# Patient Record
Sex: Male | Born: 1935 | Race: White | Hispanic: No | Smoking: Former smoker
Health system: Southern US, Community
[De-identification: ages and names within clinical notes are randomized; demographics above are authoritative.]

## PROBLEM LIST (undated history)

## (undated) DIAGNOSIS — M1990 Unspecified osteoarthritis, unspecified site: Secondary | ICD-10-CM

## (undated) DIAGNOSIS — K219 Gastro-esophageal reflux disease without esophagitis: Secondary | ICD-10-CM

## (undated) DIAGNOSIS — K5790 Diverticulosis of intestine, part unspecified, without perforation or abscess without bleeding: Secondary | ICD-10-CM

## (undated) DIAGNOSIS — I10 Essential (primary) hypertension: Secondary | ICD-10-CM

## (undated) DIAGNOSIS — Z955 Presence of coronary angioplasty implant and graft: Secondary | ICD-10-CM

## (undated) DIAGNOSIS — R351 Nocturia: Secondary | ICD-10-CM

## (undated) DIAGNOSIS — R238 Other skin changes: Secondary | ICD-10-CM

## (undated) DIAGNOSIS — R350 Frequency of micturition: Secondary | ICD-10-CM

## (undated) DIAGNOSIS — I2510 Atherosclerotic heart disease of native coronary artery without angina pectoris: Secondary | ICD-10-CM

## (undated) DIAGNOSIS — Z8719 Personal history of other diseases of the digestive system: Secondary | ICD-10-CM

## (undated) DIAGNOSIS — K5900 Constipation, unspecified: Secondary | ICD-10-CM

## (undated) DIAGNOSIS — E785 Hyperlipidemia, unspecified: Secondary | ICD-10-CM

## (undated) DIAGNOSIS — R233 Spontaneous ecchymoses: Secondary | ICD-10-CM

## (undated) HISTORY — PX: OTHER SURGICAL HISTORY: SHX169

## (undated) HISTORY — PX: CORONARY ANGIOPLASTY: SHX604

## (undated) HISTORY — PX: COLONOSCOPY: SHX174

---

## 1999-08-12 ENCOUNTER — Encounter: Payer: Self-pay | Admitting: Ophthalmology

## 1999-08-12 ENCOUNTER — Ambulatory Visit (HOSPITAL_COMMUNITY): Admission: RE | Admit: 1999-08-12 | Discharge: 1999-08-13 | Payer: Self-pay | Admitting: Ophthalmology

## 1999-11-22 ENCOUNTER — Ambulatory Visit (HOSPITAL_COMMUNITY): Admission: RE | Admit: 1999-11-22 | Discharge: 1999-11-22 | Payer: Self-pay | Admitting: Ophthalmology

## 2002-06-17 ENCOUNTER — Ambulatory Visit (HOSPITAL_COMMUNITY): Admission: RE | Admit: 2002-06-17 | Discharge: 2002-06-18 | Payer: Self-pay | Admitting: Cardiovascular Disease

## 2012-03-08 ENCOUNTER — Other Ambulatory Visit (HOSPITAL_COMMUNITY): Payer: Self-pay | Admitting: Cardiovascular Disease

## 2012-03-08 DIAGNOSIS — I719 Aortic aneurysm of unspecified site, without rupture: Secondary | ICD-10-CM

## 2012-03-09 ENCOUNTER — Other Ambulatory Visit: Payer: Self-pay | Admitting: *Deleted

## 2012-03-09 ENCOUNTER — Emergency Department (HOSPITAL_COMMUNITY)
Admission: EM | Admit: 2012-03-09 | Discharge: 2012-03-09 | Disposition: A | Discharge status: Home / Self Care | Payer: Medicare Other | Attending: Emergency Medicine | Admitting: Emergency Medicine

## 2012-03-09 ENCOUNTER — Encounter (HOSPITAL_COMMUNITY): Payer: Self-pay | Admitting: Pharmacy Technician

## 2012-03-09 ENCOUNTER — Ambulatory Visit (HOSPITAL_COMMUNITY): Admission: RE | Admit: 2012-03-09 | Payer: Self-pay | Source: Ambulatory Visit

## 2012-03-09 ENCOUNTER — Encounter (HOSPITAL_COMMUNITY): Payer: Self-pay | Admitting: *Deleted

## 2012-03-09 ENCOUNTER — Other Ambulatory Visit: Payer: Self-pay

## 2012-03-09 ENCOUNTER — Ambulatory Visit (HOSPITAL_COMMUNITY)
Admission: RE | Admit: 2012-03-09 | Discharge: 2012-03-09 | Disposition: A | Discharge status: Home / Self Care | Payer: Medicare Other | Source: Ambulatory Visit | Attending: Cardiovascular Disease | Admitting: Cardiovascular Disease

## 2012-03-09 DIAGNOSIS — K8020 Calculus of gallbladder without cholecystitis without obstruction: Secondary | ICD-10-CM | POA: Insufficient documentation

## 2012-03-09 DIAGNOSIS — Y831 Surgical operation with implant of artificial internal device as the cause of abnormal reaction of the patient, or of later complication, without mention of misadventure at the time of the procedure: Secondary | ICD-10-CM | POA: Insufficient documentation

## 2012-03-09 DIAGNOSIS — I719 Aortic aneurysm of unspecified site, without rupture: Secondary | ICD-10-CM

## 2012-03-09 DIAGNOSIS — M48061 Spinal stenosis, lumbar region without neurogenic claudication: Secondary | ICD-10-CM | POA: Insufficient documentation

## 2012-03-09 DIAGNOSIS — I714 Abdominal aortic aneurysm, without rupture, unspecified: Secondary | ICD-10-CM

## 2012-03-09 DIAGNOSIS — I723 Aneurysm of iliac artery: Secondary | ICD-10-CM | POA: Insufficient documentation

## 2012-03-09 DIAGNOSIS — T82598A Other mechanical complication of other cardiac and vascular devices and implants, initial encounter: Secondary | ICD-10-CM | POA: Insufficient documentation

## 2012-03-09 DIAGNOSIS — I10 Essential (primary) hypertension: Secondary | ICD-10-CM | POA: Insufficient documentation

## 2012-03-09 HISTORY — DX: Presence of coronary angioplasty implant and graft: Z95.5

## 2012-03-09 HISTORY — DX: Essential (primary) hypertension: I10

## 2012-03-09 LAB — CBC
HCT: 38.3 % — ABNORMAL LOW (ref 39.0–52.0)
Hemoglobin: 13 g/dL (ref 13.0–17.0)
MCH: 30.1 pg (ref 26.0–34.0)
MCHC: 33.9 g/dL (ref 30.0–36.0)
RDW: 12.9 % (ref 11.5–15.5)

## 2012-03-09 LAB — DIFFERENTIAL
Basophils Absolute: 0 10*3/uL (ref 0.0–0.1)
Basophils Relative: 0 % (ref 0–1)
Eosinophils Absolute: 0.3 10*3/uL (ref 0.0–0.7)
Monocytes Absolute: 0.4 10*3/uL (ref 0.1–1.0)
Monocytes Relative: 5 % (ref 3–12)

## 2012-03-09 LAB — CREATININE, SERUM
Creatinine, Ser: 0.88 mg/dL (ref 0.50–1.35)
GFR calc Af Amer: >90 mL/min (ref >90)
GFR calc non Af Amer: 78 mL/min — ABNORMAL LOW (ref >90)

## 2012-03-09 LAB — CARDIAC PANEL(CRET KIN+CKTOT+MB+TROPI)
CK, MB: 3.2 ng/mL (ref 0.3–4.0)
Relative Index: INVALID (ref 0.0–2.5)
Troponin I: <0.3 ng/mL (ref <0.30)

## 2012-03-09 LAB — ABO/RH: ABO/RH(D): A POS

## 2012-03-09 LAB — BASIC METABOLIC PANEL
BUN: 22 mg/dL (ref 6–23)
Calcium: 9.3 mg/dL (ref 8.4–10.5)
Creatinine, Ser: 0.8 mg/dL (ref 0.50–1.35)
GFR calc Af Amer: >90 mL/min (ref >90)
GFR calc non Af Amer: 82 mL/min — ABNORMAL LOW (ref >90)

## 2012-03-09 LAB — BUN: BUN: 24 mg/dL — ABNORMAL HIGH (ref 6–23)

## 2012-03-09 MED ORDER — IOHEXOL 350 MG/ML SOLN
100.0000 mL | Freq: Once | INTRAVENOUS | Status: AC | PRN
  Administered 2012-03-09: 100 mL via INTRAVENOUS

## 2012-03-09 NOTE — Discharge Instructions (Signed)
Abdominal Aortic Aneurysm Follow up with Dr. Arbie Cookey and Dr. Allyson Sabal as scheduled.  Return to the ED if you develop new or worsening symptoms.  An aneurysm is the enlargement (dilatation), bulging, or ballooning out of part of the wall of a vein or artery. An aortic aneurysm is a bulging in the largest artery of the body. This artery supplies blood from the heart to the rest of the body.  The first part of the aorta is called the thoracic aorta. It leaves the heart, rises (ascends), arches, and goes down (descends) through the chest until it reaches the diaphragm. The diaphragm is the muscular part between the chest and abdomen.   The second part of the aorta is called the abdominal aorta after it has passed the diaphragm and continues down through the abdomen. The abdominal aorta ends where it splits to form the two iliac arteries that go to the legs.  Aortic aneurysms can develop anywhere along the length of the aorta. The majority are located along the abdominal aorta. The major concern with an aortic aneurysm is that it can enlarge and rupture. This can cause death unless diagnosed and treated promptly. Aneurysms can also develop blood clots or infections. CAUSES  Many aortic aneurysms are caused by arteriosclerosis. Arteriosclerosis can weaken the aortic wall. The pressure of the blood being pumped through the aorta causes it to balloon out at the site of weakness. Therefore, high blood pressure (hypertension) is associated with aneurysm. Other risk factors include:  Age over 64.   Tobacco use.   Being male.   White race.   Family history of aneurysm.   Less frequent causes of abdominal aortic aneurysms include:   Connective tissue diseases.   Abdominal trauma.   Inflammation of blood vessles (arteritis).   Inherited (congenital) malformations.   Infection.  SYMPTOMS  The signs and symptoms of an unruptured aneurysm will partly depend on its size and rate of growth.   Abdominal  aortic aneurysms may cause pain. The pain typically has a deep quality as if it is piercing into the person. It is felt most often in the lower back area. The pain is usually steady but may be relieved by changing your body position.   The person may also become aware of an abnormally prominent pulse in the belly (abdominal pulsation).  DIAGNOSIS  An aortic aneurysm may be discovered by chance on physical exam, or on X-ray studies done for other reasons. It may be suspected because of other problems such as back or abdominal pain. The following tests may help identify the problem.  X-rays of the abdomen can show calcium deposits in the aneurysm wall.   CT scanning of the abdomen, particularly with contrast medium, is accurate at showing the exact size and shape of the aneurysm.   Ultrasounds give a clear picture of the size of an aneurysm (about 98% accuracy).   MRI scanning is accurate, but often unnecessary.   An abdominal angiogram shows the source of the major blood vessels arising from the aorta. It reveals the size and extent of any aneurysm. It can also show a clot clinging to the wall of the aneurysm (mural thrombus).  TREATMENT  Treating an abdominal aortic aneurysm depends on the size. A rupture of an aneurysm is uncommon when they are less than 5 cm wide (2 inches). Rupture is far more common in aneurysms that are over 6 cm wide (2.4 inches).  Surgical repair is usually recommended for all aneurysms over 6  cm wide (2.4 inches). This depends on the health, age, and other circumstances of the individual. This type of surgery consists of opening the abdomen, removing the aneurysm, and sewing a synthetic graft (similar to a cloth tube) in its place. A less invasive form of this surgery, using stent grafts, is sometimes recommended.   For most patients, elective repair is recommended for aneurysms between 4 and 6 cm (1.6 and 2.4 inches). Elective means the surgery can be done at your  convenience. This should not be put off too long if surgery is recommended.   If you smoke, stop immediately. Smoking is a major risk factor for enlargement and rupture.   Medications may be used to help decrease complications - these include medicine to lower blood pressure and control cholesterol.  HOME CARE INSTRUCTIONS   If you smoke, stop. Do not start smoking.   Take all medications as prescribed.   Your caregiver will tell you when to have your aneurysm rechecked, either by ultrasound or CT scan.   If your caregiver has given you a follow-up appointment, it is very important to keep that appointment. Not keeping the appointment could result in a chronic or permanent injury, pain, or disability. If there is any problem keeping the appointment, you must call back to this facility for assistance.  SEEK MEDICAL CARE IF:   You develop mild abdominal pain or pressure.   You are able to feel or perceive your aneurysm, and you sense any change.  SEEK IMMEDIATE MEDICAL CARE IF:   You develop severe abdominal pain, or severe pain moving (radiating) to your back.   You suddenly develop cold or blue toes or feet.   You suddenly develop lightheadedness or fainting spells.  MAKE SURE YOU:   Understand these instructions.   Will watch your condition.   Will get help right away if you are not doing well or get worse.  Document Released: 07/27/2005 Document Revised: 10/06/2011 Document Reviewed: 05/20/2008 [facility] Patient Information 2012 [place], [place].

## 2012-03-09 NOTE — ED Notes (Signed)
CT scan today and MD office called him to come to ED for leaking aneurysm, patient denies pain/sob

## 2012-03-09 NOTE — ED Provider Notes (Signed)
History     CSN: [id number]  Arrival date & time 03/09/12  1224   First MD Initiated Contact with Patient 03/09/12 1232      Chief Complaint  Patient presents with  . Aneurysm    (Consider location/radiation/quality/duration/timing/severity/associated sxs/prior treatment) HPI Comments: Patient presents for evaluation of a leaking aortoiliac graft. He had this placed in 2002 in [place]. He followed by Dr. Allyson Sabal was increased in size. CT scan today shows an oblique with aneurysm. Patient denies any pain, shortness of breath, bleeding. No chest pain, back pain, abdominal pain, nausea or vomiting. Denies any weakness, numbness or tingling.  The history is provided by the patient.    Past Medical History  Diagnosis Date  . Hypertension   . Stented coronary artery     History reviewed. No pertinent past surgical history.  No family history on file.  History  Substance Use Topics  . Smoking status: Never Smoker   . Smokeless tobacco: Not on file  . Alcohol Use: No      Review of Systems  Constitutional: Negative for fever, activity change and appetite change.  HENT: Negative for congestion and rhinorrhea.   Respiratory: Negative for cough, chest tightness and shortness of breath.   Cardiovascular: Negative for chest pain.  Gastrointestinal: Negative for nausea, vomiting and abdominal pain.  Genitourinary: Negative for dysuria and hematuria.  Musculoskeletal: Negative for back pain.  Neurological: Negative for dizziness, light-headedness and headaches.    Allergies  Review of patient's allergies indicates no known allergies.  Home Medications  No current outpatient prescriptions on file.  BP 144/75  Temp(Src) 97.3 F (36.3 C) (Oral)  Resp 20  SpO2 97%  Physical Exam  Constitutional: He is oriented to person, place, and time. He appears well-developed and well-nourished. No distress.  HENT:  Head: Normocephalic and atraumatic.  Mouth/Throat: Oropharynx is  clear and moist. No oropharyngeal exudate.  Eyes: Conjunctivae and EOM are normal. Pupils are equal, round, and reactive to light.  Neck: Normal range of motion. Neck supple.  Cardiovascular: Normal rate, regular rhythm and normal heart sounds.        +2 femoral, radial, DP pulse  Pulmonary/Chest: Effort normal and breath sounds normal. No respiratory distress.  Abdominal: Soft. He exhibits no mass. There is no tenderness. There is no rebound and no guarding.       No palpable mass  Musculoskeletal: Normal range of motion. He exhibits no edema and no tenderness.  Neurological: He is alert and oriented to person, place, and time. No cranial nerve deficit.  Skin: Skin is warm.    ED Course  Procedures (including critical care time)   Labs Reviewed  CBC  DIFFERENTIAL  BASIC METABOLIC PANEL  TYPE AND SCREEN  PROTIME-INR  CARDIAC PANEL(CRET KIN+CKTOT+MB+TROPI)   Ct Angio Chest W/cm &/or Wo Cm  03/09/2012  *RADIOLOGY REPORT*  Clinical Data:  Stent graft  CT ANGIOGRAPHY CHEST, ABDOMEN AND PELVIS  Technique:  Multidetector CT imaging through the chest, abdomen and pelvis was performed using the standard protocol during bolus administration of intravenous contrast.  Multiplanar reconstructed images including MIPs were obtained and reviewed to evaluate the vascular anatomy. Precontrast images were not performed.  8-minute delayed images were performed.  Contrast: OMNIPAQUE IOHEXOL 350 MG/ML SOLN  Comparison:   None.  CTA CHEST  Findings:  No evidence of aortic dissection or transection.  No evidence of aneurysm.  Mild aortic valvular calcifications.  Left subclavian, left common carotid artery, innominate artery, right subclavian artery,  right common carotid artery are patent. Atherosclerotic changes at the origin of the right subclavian and in the arch are noted.  No definite filling defect in the pulmonary arterial tree to suggest acute pulmonary thromboembolism.  No evidence of abnormal  mediastinal adenopathy.  No pericardial effusion.  Mild coronary artery calcifications.  Clear lungs.  No pneumothorax and no pleural effusion.  Mild T11 compression deformity has a chronic appearance.   Review of the MIP images confirms the above findings.  IMPRESSION: No evidence of aortic aneurysm or dissection.  CTA ABDOMEN AND PELVIS  Findings:  Aortobi-iliac stent graft is in place.  An obvious endoleak is present as there is avid enhancement of the central portion of the aneurysm sac adjacent to the iliac limbs of the graft.  Maximal aneurysm sac diameter is 9.2 cm.  The central area of contrast enhancement within the aneurysm sac is 3.5 x 5.0 cm and causes severe mass effect upon the iliac limbs of the graft.  This narrows the left iliac limb which to 5 mm.  The lead can be confirmed comparing arterial phase images to delayed phase images with Hounsfield unit measurements. No obvious lumbar vessel is seen leading to the area of endoleak.  There is no obvious type 1 endoleak from the aortic neck or the landing zones.  Ectasia of the left common iliac artery measures up to 2.4 cm. Right common iliac artery is 1.6 cm in caliber.  There is no evidence of contrast extravasation or hemorrhage beyond the aneurysm sac.  Native internal and external iliac arteries are patent.  IMA branches reconstitute.  Renal arteries are patent bilaterally.  SMA and celiac are patent.  Cholelithiasis.  Liver, pancreas, spleen, adrenal glands are within normal limits.  Chronic changes of the kidneys.  Normal appendix. No free fluid.  No abnormal adenopathy.  Degenerative changes in the lumbar spine are present.  There is severe critical spinal stenosis at at L4-5.   Review of the MIP images confirms the above findings.  IMPRESSION: Aortobi-iliac stent graft with a significant endoleak exerting mass effect upon the iliac limbs of the graft as described.  Aneurysm sac diameter is 9.2 cm.  It is uncertain as to whether this represents  a type 2 or type 1 endoleak. Critical Value/emergent results were called by telephone at the time of interpretation on 03/09/2012  at 1140 hours   to  Samara Deist, RN, Dr. Hazle Coca office, who verbally acknowledged these results.  Cholelithiasis  Severe spinal stenosis at X9-1 which certainly could contribute to hip pain.  Original Report Authenticated By: Donavan Burnet, M.D.   Ct Angio Abd/pel W/ And/or W/o  03/09/2012  *RADIOLOGY REPORT*  Clinical Data:  Stent graft  CT ANGIOGRAPHY CHEST, ABDOMEN AND PELVIS  Technique:  Multidetector CT imaging through the chest, abdomen and pelvis was performed using the standard protocol during bolus administration of intravenous contrast.  Multiplanar reconstructed images including MIPs were obtained and reviewed to evaluate the vascular anatomy. Precontrast images were not performed.  8-minute delayed images were performed.  Contrast: OMNIPAQUE IOHEXOL 350 MG/ML SOLN  Comparison:   None.  CTA CHEST  Findings:  No evidence of aortic dissection or transection.  No evidence of aneurysm.  Mild aortic valvular calcifications.  Left subclavian, left common carotid artery, innominate artery, right subclavian artery, right common carotid artery are patent. Atherosclerotic changes at the origin of the right subclavian and in the arch are noted.  No definite filling defect in the pulmonary arterial  tree to suggest acute pulmonary thromboembolism.  No evidence of abnormal mediastinal adenopathy.  No pericardial effusion.  Mild coronary artery calcifications.  Clear lungs.  No pneumothorax and no pleural effusion.  Mild T11 compression deformity has a chronic appearance.   Review of the MIP images confirms the above findings.  IMPRESSION: No evidence of aortic aneurysm or dissection.  CTA ABDOMEN AND PELVIS  Findings:  Aortobi-iliac stent graft is in place.  An obvious endoleak is present as there is avid enhancement of the central portion of the aneurysm sac adjacent to the iliac  limbs of the graft.  Maximal aneurysm sac diameter is 9.2 cm.  The central area of contrast enhancement within the aneurysm sac is 3.5 x 5.0 cm and causes severe mass effect upon the iliac limbs of the graft.  This narrows the left iliac limb which to 5 mm.  The lead can be confirmed comparing arterial phase images to delayed phase images with Hounsfield unit measurements. No obvious lumbar vessel is seen leading to the area of endoleak.  There is no obvious type 1 endoleak from the aortic neck or the landing zones.  Ectasia of the left common iliac artery measures up to 2.4 cm. Right common iliac artery is 1.6 cm in caliber.  There is no evidence of contrast extravasation or hemorrhage beyond the aneurysm sac.  Native internal and external iliac arteries are patent.  IMA branches reconstitute.  Renal arteries are patent bilaterally.  SMA and celiac are patent.  Cholelithiasis.  Liver, pancreas, spleen, adrenal glands are within normal limits.  Chronic changes of the kidneys.  Normal appendix. No free fluid.  No abnormal adenopathy.  Degenerative changes in the lumbar spine are present.  There is severe critical spinal stenosis at at L4-5.   Review of the MIP images confirms the above findings.  IMPRESSION: Aortobi-iliac stent graft with a significant endoleak exerting mass effect upon the iliac limbs of the graft as described.  Aneurysm sac diameter is 9.2 cm.  It is uncertain as to whether this represents a type 2 or type 1 endoleak. Critical Value/emergent results were called by telephone at the time of interpretation on 03/09/2012  at 1140 hours   to  Samara Deist, RN, Dr. Hazle Coca office, who verbally acknowledged these results.  Cholelithiasis  Severe spinal stenosis at Z6-1 which certainly could contribute to hip pain.  Original Report Authenticated By: Donavan Burnet, M.D.     No diagnosis found.    MDM  Endoleak from aortoiliac graft. Vitals stable.  No abdominal pain, chest or back pain.   Labs, type  and cross, d/w Dr. Arbie Cookey.  Extensive discussion with Dr. Arbie Cookey who has seen patient.  Patient has not had graft imaged since surgery in 2003.  Dr. Arbie Cookey suspects endoleak is type 2 and stable.  He suspects patient has had process ongoing for a long period of time and not had imaging in interim.  He is asymptomatic and stable for discharge.  Aortogram with Dr. Imogene Burn scheduled for MOnday.  Patient and family in agreement.   Date: 03/09/2012  Rate: 92  Rhythm: normal sinus rhythm  QRS Axis: left  Intervals: normal  ST/T Wave abnormalities: normal  Conduction Disutrbances:none  Narrative Interpretation:   Old EKG Reviewed: unchanged        Glynn Octave, MD 03/09/12 1711

## 2012-03-09 NOTE — Consult Note (Signed)
I was consulted urgently regarding his CT scan today documenting endoleak. I have reviewed his CT scan, discuss this with the patient and his daughter present, and I discussed this with Dr. Hazle Coca office. The patient had a stent graft repair in [facility] an approximately 2002. He has had ultrasound followup at that time. He had an ultrasound 4 days ago suggested a change in his prior studies and he was scheduled for a CT scan today. A CT scan demonstrated a 9 cm aneurysmal sac within endoleak. He was told to come immediately to the emergency department and I was consulted. The patient is completely asymptomatic. Specifically he has no abdominal or back pain. He had no new concerns other than his routine followup. He feels that his aneurysm size was proximally 4-1/2 cm when it was repaired in 2002. It appeared or his daughter recall any discussion of aneurysm sac size since then. I spoke with Dr. Benay Spice office staff and they reviewed the finding is of his ultrasound from 2010, 2011, and 2012. None of these document the sac size. They do report good flow through both limbs with no evidence of endoleak. He does have a prior history of significant coronary disease with multiple prior coronary stents. He does not have a history of peripheral aneurysm. He did have coiling of a lower pole accessory left renal artery at the time of the stent graft repair.  Past Medical History  Diagnosis Date  . Hypertension   . Stented coronary artery     History  Substance Use Topics  . Smoking status: Never Smoker   . Smokeless tobacco: Not on file  . Alcohol Use: No    No family history on file.  No Known Allergies  No current facility-administered medications for this encounter. Current outpatient prescriptions:aspirin 500 MG EC tablet, Take 500 mg by mouth every 6 (six) hours as needed., Disp: , Rfl: ;  clopidogrel (PLAVIX) 75 MG tablet, Take 75 mg by mouth daily., Disp: , Rfl: ;  colesevelam  (WELCHOL) 625 MG tablet, Take 2,500 mg by mouth 2 (two) times daily with a meal., Disp: , Rfl: ;  gemfibrozil (LOPID) 600 MG tablet, Take 600 mg by mouth 2 (two) times daily before a meal., Disp: , Rfl:  metoprolol (TOPROL-XL) 200 MG 24 hr tablet, Take 200 mg by mouth daily., Disp: , Rfl: ;  NIFEdipine (PROCARDIA XL/ADALAT-CC) 60 MG 24 hr tablet, Take 60 mg by mouth daily., Disp: , Rfl: ;  nitroGLYCERIN (NITROSTAT) 0.4 MG SL tablet, Place 0.4 mg under the tongue every 5 (five) minutes as needed. As needed for chest pain., Disp: , Rfl: ;  omeprazole (PRILOSEC) 20 MG capsule, Take 20 mg by mouth daily., Disp: , Rfl:  psyllium (HYDROCIL/METAMUCIL) 95 % PACK, Take 1 packet by mouth daily., Disp: , Rfl: ;  triamterene-hydrochlorothiazide (MAXZIDE-25) 37.5-25 MG per tablet, Take 1 tablet by mouth daily., Disp: , Rfl:  Facility-Administered Medications Ordered in Other Encounters: iohexol (OMNIPAQUE) 350 MG/ML injection 100 mL, 100 mL, Intravenous, Once PRN, Medication Radiologist, MD, 100 mL at 03/09/12 1106  BP 117/79  Pulse 87  Temp(Src) 97.9 F (36.6 C) (Oral)  Resp 16  Ht 7\' 3"  (2.21 m)  Wt 230 lb (104.327 kg)  BMI 21.36 kg/m2  SpO2 97%  Body mass index is 21.36 kg/(m^2).   Review of systems: Negative except for above  Physical exam: Well-developed well-nourished white male appearing stated age in no acute distress HEENT normal. 2+ radial pulses. 2+ femoral pulses.  2+ popliteal pulses. 2+ dorsalis pedis pulses. He is no evidence of femoral or popliteal aneurysm. Chest good breath air exchange bilaterally. Monitors normal sinus rhythm. Abdomen soft nontender I do not feel any masses. I do not feel a pulsatile mass in his aorta. Musculoskeletal no clubbing or cyanosis. Neurologically grossly intact. Skin without ulcers or rashes.  CT scan abdomen and pelvis: This shows what appears to be a Ancure aortic stent graft. There does not appear to be a type I endoleak. There does appear to be compression  of the limbs of the graft more so on the left than on the right. There is an endoleak and the sac and the maximal size is 9 cm.   Impression and plan: Enlarging abdominal aortic aneurysm sac after stent graft repair in 2002. I did not have any documentation as to prior sizes of the aneurysm. According to the patient patient and his daughter they have not had a stent graft CT scan followup since implantation. I explained to the patient that this does require elective evaluation and possible treatment since this is a large aneurysm with a large endoleak. He is discharged from the emergency department today and we will schedule him for arteriography next week to begin to evaluate the cause of his endoleak. I explained that this is not equivalent to a leaking aneurysm. They are comfortable with this discussion and will be discharged. We will see him again next week for arteriography

## 2012-03-09 NOTE — ED Notes (Signed)
Family at the bedside, daughter Linton Rump) [phone] home. (cell) [phone]

## 2012-03-09 NOTE — ED Notes (Signed)
Pt has hx of AAA, had a stent placed in it in 2002.  Has been having ultrasounds yearly since that time.  Reports that he had his routine [place] on Monday and was sent for a CT scan today.  Pt was called and told that his aneurysm was leaking and he needed to come to the ED for further eval and possible stent placement and repair.  No distress noted, pt resting.  Denies pain, no bounding mass in abdomen.

## 2012-03-12 ENCOUNTER — Ambulatory Visit (HOSPITAL_COMMUNITY)
Admission: RE | Admit: 2012-03-12 | Discharge: 2012-03-12 | Disposition: A | Discharge status: Home / Self Care | Payer: Medicare Other | Source: Ambulatory Visit | Attending: Vascular Surgery | Admitting: Vascular Surgery

## 2012-03-12 ENCOUNTER — Encounter (HOSPITAL_COMMUNITY)
Admission: RE | Disposition: A | Discharge status: Home / Self Care | Payer: Self-pay | Source: Ambulatory Visit | Attending: Vascular Surgery

## 2012-03-12 ENCOUNTER — Other Ambulatory Visit (HOSPITAL_COMMUNITY): Payer: Self-pay

## 2012-03-12 DIAGNOSIS — I714 Abdominal aortic aneurysm, without rupture, unspecified: Secondary | ICD-10-CM

## 2012-03-12 HISTORY — PX: ABDOMINAL AORTAGRAM: SHX5454

## 2012-03-12 LAB — POCT I-STAT, CHEM 8
BUN: 22 mg/dL (ref 6–23)
Calcium, Ion: 1.17 mmol/L (ref 1.12–1.32)
Chloride: 105 mEq/L (ref 96–112)
Creatinine, Ser: 1.3 mg/dL (ref 0.50–1.35)
Glucose, Bld: 110 mg/dL — ABNORMAL HIGH (ref 70–99)

## 2012-03-12 SURGERY — ABDOMINAL AORTAGRAM
Anesthesia: LOCAL

## 2012-03-12 MED ORDER — MIDAZOLAM HCL 2 MG/2ML IJ SOLN
INTRAMUSCULAR | Status: AC
  Filled 2012-03-12: qty 2

## 2012-03-12 MED ORDER — SODIUM CHLORIDE 0.9 % IV SOLN
INTRAVENOUS | Status: DC
  Administered 2012-03-12: 09:00:00 via INTRAVENOUS

## 2012-03-12 MED ORDER — FENTANYL CITRATE 0.05 MG/ML IJ SOLN
INTRAMUSCULAR | Status: AC
  Filled 2012-03-12: qty 2

## 2012-03-12 NOTE — Interval H&P Note (Signed)
[facility]  History and Physical Update  The patient was interviewed and re-examined.  The patient's previous History and Physical has been reviewed and is unchanged.  There is no change in the plan of care: aortogram.  Leonides Sake, MD [facility]: [phone] Pager: [phone]  03/12/2012, 9:13 AM

## 2012-03-12 NOTE — Op Note (Addendum)
OPERATIVE NOTE   PROCEDURE: 1.  Right common femoral artery cannulation under ultrasound guidance 2.  Aortogram 3.  Left common iliac artery selection 4.  Bilateral pelvic angiogram  PRE-OPERATIVE DIAGNOSIS: s/p EVAR, endoleak, enlarging abdominal aortic aneurysm  POST-OPERATIVE DIAGNOSIS: same as above   SURGEON: Leonides Sake, MD  ANESTHESIA: conscious sedation  ESTIMATED BLOOD LOSS: 30 cc  CONTRAST: 170 cc  FINDING(S):  Aorta: patent, Ancure endograft present, no evidence of proximal type I endoleak, Obvious filling of aortic sac without evidence of discrete tear or endoleak: suggestive of type IV endoleak through graft material  Superior mesenteric artery: Widely patent Celiac artery: Partially imaged   Right Left  RA Patent, no nephrogram Patent, no nephrogram  CIA Patent, Right limb patent without evidence of distal Type I endoleak Patent, aneurysmal, lumen up to 2.0 cm, Left limb patent without evidence of distal Type I endoleak  EIA Patent Patent  IIA Patent Patent  CFA Patent Patent   SPECIMEN(S):  none  INDICATIONS:   Johnny Leblanc is a 78 y.o. male who presents with enlarging abdominal aortic aneurysm despite previous endovascular aortic repair.  The patient was found to an unknown type endoleak.  The patient presents for: diagnostic aortography to try to determine the source of the endoleak.  I discussed with the patient the nature of angiographic procedures, especially the limited patencies of any endovascular intervention.  The patient is aware of that the risks of an angiographic procedure include but are not limited to: bleeding, infection, access site complications, renal failure, embolization, rupture of vessel, dissection, possible need for emergent surgical intervention, possible need for surgical procedures to treat the patient's pathology, and stroke and death.  The patient is aware of the risks and agrees to proceed.  DESCRIPTION: After full informed consent  was obtained from the patient, the patient was brought back to the angiography suite.  The patient was placed supine upon the angiography table and connected to monitoring equipment.  The patient was then given conscious sedation, the amounts of which are documented in the patient's chart.  The patient was prepped and drape in the standard fashion for an angiographic procedure.  At this point, attention was turned to the right groin. Under ultrasound guidance, the right common femoral artery will be cannulated with a 18 gauge needle.  The [facility] wire was passed up into the aorta.  The needle was exchanged for a 5-Fr sheath, which was advanced over the wire into the common femoral artery.  The dilator was then removed.   The Omniflush catheter was then loaded over the wire up to the level of L1.  The catheter was connected to the power injector circuit.  After de-airring and de-clotting the circuit, a power injector aortogram was completed.  A lateral aortogram was completed.  The catheter was pulled down to just proximal to the graft bifurcation.  RAO and LAO aortograms were completed.  The RAO projection clearly demonstrates an endoleak without evidence of a discrete endoleak or tear in the endoleak.  There was evidence of a Type I endoleak.  I selected out the left common iliac artery with a Omniflush and a Benson wire.  The catheter was exchanged for a Motarjeme catheter which was lodging into the common iliac artery.  A power injection via the catheter demonstrated no Type I distal endoleak.  I exchanged the catheter over a Benson wire for the Omniflush catheter.  The catheter was lodged in the right iliac limb.  A power  injection via the catheter demonstrated no Type I distal endoleak.  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  The catheter was straighten out with a Teena Dunk wire and  both were removed.  The sheath was aspirated.  No clots were present and the sheath was reloaded with heparinized saline.    COMPLICATIONS: none  CONDITION: stable  Leonides Sake, MD [facility] Office: [phone] Pager: [phone]  03/12/2012, 12:16 PM

## 2012-03-12 NOTE — H&P (View-Only) (Signed)
I was consulted urgently regarding his CT scan today documenting endoleak. I have reviewed his CT scan, discuss this with the patient and his daughter present, and I discussed this with Dr. Berry's office. The patient had a stent graft repair in [place] an approximately 2002. He has had ultrasound followup at that time. He had an ultrasound 4 days ago suggested a change in his prior studies and he was scheduled for a CT scan today. A CT scan demonstrated a 9 cm aneurysmal sac within endoleak. He was told to come immediately to the emergency department and I was consulted. The patient is completely asymptomatic. Specifically he has no abdominal or back pain. He had no new concerns other than his routine followup. He feels that his aneurysm size was proximally 4-1/2 cm when it was repaired in 2002. It appeared or his daughter recall any discussion of aneurysm sac size since then. I spoke with Dr. Barry's office staff and they reviewed the finding is of his ultrasound from 2010, 2011, and 2012. None of these document the sac size. They do report good flow through both limbs with no evidence of endoleak. He does have a prior history of significant coronary disease with multiple prior coronary stents. He does not have a history of peripheral aneurysm. He did have coiling of a lower pole accessory left renal artery at the time of the stent graft repair.  Past Medical History  Diagnosis Date  . Hypertension   . Stented coronary artery     History  Substance Use Topics  . Smoking status: Never Smoker   . Smokeless tobacco: Not on file  . Alcohol Use: No    No family history on file.  No Known Allergies  No current facility-administered medications for this encounter. Current outpatient prescriptions:aspirin 500 MG EC tablet, Take 500 mg by mouth every 6 (six) hours as needed., Disp: , Rfl: ;  clopidogrel (PLAVIX) 75 MG tablet, Take 75 mg by mouth daily., Disp: , Rfl: ;  colesevelam  (WELCHOL) 625 MG tablet, Take 2,500 mg by mouth 2 (two) times daily with a meal., Disp: , Rfl: ;  gemfibrozil (LOPID) 600 MG tablet, Take 600 mg by mouth 2 (two) times daily before a meal., Disp: , Rfl:  metoprolol (TOPROL-XL) 200 MG 24 hr tablet, Take 200 mg by mouth daily., Disp: , Rfl: ;  NIFEdipine (PROCARDIA XL/ADALAT-CC) 60 MG 24 hr tablet, Take 60 mg by mouth daily., Disp: , Rfl: ;  nitroGLYCERIN (NITROSTAT) 0.4 MG SL tablet, Place 0.4 mg under the tongue every 5 (five) minutes as needed. As needed for chest pain., Disp: , Rfl: ;  omeprazole (PRILOSEC) 20 MG capsule, Take 20 mg by mouth daily., Disp: , Rfl:  psyllium (HYDROCIL/METAMUCIL) 95 % PACK, Take 1 packet by mouth daily., Disp: , Rfl: ;  triamterene-hydrochlorothiazide (MAXZIDE-25) 37.5-25 MG per tablet, Take 1 tablet by mouth daily., Disp: , Rfl:  Facility-Administered Medications Ordered in Other Encounters: iohexol (OMNIPAQUE) 350 MG/ML injection 100 mL, 100 mL, Intravenous, Once PRN, Medication Radiologist, MD, 100 mL at 03/09/12 1106  BP 117/79  Pulse 87  Temp(Src) 97.9 F (36.6 C) (Oral)  Resp 16  Ht 7' 3" (2.21 m)  Wt 230 lb (104.327 kg)  BMI 21.36 kg/m2  SpO2 97%  Body mass index is 21.36 kg/(m^2).   Review of systems: Negative except for above  Physical exam: Well-developed well-nourished white male appearing stated age in no acute distress HEENT normal. 2+ radial pulses. 2+ femoral pulses.   2+ popliteal pulses. 2+ dorsalis pedis pulses. He is no evidence of femoral or popliteal aneurysm. Chest good breath air exchange bilaterally. Monitors normal sinus rhythm. Abdomen soft nontender I do not feel any masses. I do not feel a pulsatile mass in his aorta. Musculoskeletal no clubbing or cyanosis. Neurologically grossly intact. Skin without ulcers or rashes.  CT scan abdomen and pelvis: This shows what appears to be a Ancure aortic stent graft. There does not appear to be a type I endoleak. There does appear to be compression  of the limbs of the graft more so on the left than on the right. There is an endoleak and the sac and the maximal size is 9 cm.   Impression and plan: Enlarging abdominal aortic aneurysm sac after stent graft repair in 2002. I did not have any documentation as to prior sizes of the aneurysm. According to the patient patient and his daughter they have not had a stent graft CT scan followup since implantation. I explained to the patient that this does require elective evaluation and possible treatment since this is a large aneurysm with a large endoleak. He is discharged from the emergency department today and we will schedule him for arteriography next week to begin to evaluate the cause of his endoleak. I explained that this is not equivalent to a leaking aneurysm. They are comfortable with this discussion and will be discharged. We will see him again next week for arteriography    

## 2012-03-12 NOTE — Discharge Instructions (Signed)

## 2012-03-30 ENCOUNTER — Other Ambulatory Visit: Payer: Self-pay

## 2012-04-04 ENCOUNTER — Encounter (HOSPITAL_COMMUNITY): Payer: Self-pay | Admitting: Respiratory Therapy

## 2012-04-04 ENCOUNTER — Encounter (HOSPITAL_COMMUNITY): Payer: Self-pay | Admitting: *Deleted

## 2012-04-04 NOTE — Progress Notes (Addendum)
Cardiologist is DR.Berry @ [facility] and to request last office note  Stress test done5/17/13 @ [facility]-to request  Never had an echo  Last heart cath in epic from 03/12/12  Dr.John Samuel Germany in [place]   Denies having any angina/chest pain and has never used NTg

## 2012-04-04 NOTE — Pre-Procedure Instructions (Addendum)
22 Johnny Leblanc  04/04/2012   Your procedure is scheduled on: Wednesday, June 19 @ 8:30  Report to [facility] at 6:30 AM.  Call this number if you have problems the morning of surgery: [phone]   Remember:   Do not eat food:4 Hours before arrival.  May have clear liquids: up to 4 Hours before arrival.(until 1:30 AM)  Clear liquids include soda, tea, black coffee, apple or grape juice, broth,water  Take these medicines the morning of surgery with A SIP OF WATER: Metoprolol(Toprol), Nifedipine(Procardia),and Omeprazole(Prilosec)   Do not wear jewelry, make-up or nail polish.  Do not wear lotions, powders, or perfumes.   Men may shave face and neck.  Do not bring valuables to the hospital.  Contacts, dentures or bridgework may not be worn into surgery.  Leave suitcase in the car. After surgery it may be brought to your room.  For patients admitted to the hospital, checkout time is 11:00 AM the day of discharge.     Special Instructions: CHG Shower Use Special Wash: 1/2 bottle night before surgery and 1/2 bottle morning of surgery.   Please read over the following fact sheets that you were given: Pain Booklet, Coughing and Deep Breathing, Blood Transfusion Information, MRSA Information and Surgical Site Infection Prevention

## 2012-04-06 ENCOUNTER — Encounter (HOSPITAL_COMMUNITY)
Admission: RE | Admit: 2012-04-06 | Discharge: 2012-04-06 | Disposition: A | Discharge status: Home / Self Care | Payer: Medicare Other | Source: Ambulatory Visit | Attending: Vascular Surgery | Admitting: Vascular Surgery

## 2012-04-06 LAB — BLOOD GAS, ARTERIAL
Bicarbonate: 22 mEq/L (ref 20.0–24.0)
FIO2: 0.21 %
Patient temperature: 98.6
TCO2: 23.2 mmol/L (ref 0–100)
pCO2 arterial: 36.7 mmHg (ref 35.0–45.0)
pH, Arterial: 7.396 (ref 7.350–7.450)

## 2012-04-06 LAB — CBC
HCT: 36.8 % — ABNORMAL LOW (ref 39.0–52.0)
Hemoglobin: 12.1 g/dL — ABNORMAL LOW (ref 13.0–17.0)
MCHC: 32.9 g/dL (ref 30.0–36.0)
RDW: 13.4 % (ref 11.5–15.5)
WBC: 7 10*3/uL (ref 4.0–10.5)

## 2012-04-06 LAB — SURGICAL PCR SCREEN: Staphylococcus aureus: POSITIVE — AB

## 2012-04-06 LAB — COMPREHENSIVE METABOLIC PANEL
ALT: 7 U/L (ref 0–53)
Albumin: 3.2 g/dL — ABNORMAL LOW (ref 3.5–5.2)
Alkaline Phosphatase: 102 U/L (ref 39–117)
Chloride: 102 mEq/L (ref 96–112)
Glucose, Bld: 117 mg/dL — ABNORMAL HIGH (ref 70–99)
Potassium: 3.9 mEq/L (ref 3.5–5.1)
Sodium: 135 mEq/L (ref 135–145)
Total Bilirubin: 0.2 mg/dL — ABNORMAL LOW (ref 0.3–1.2)
Total Protein: 7.9 g/dL (ref 6.0–8.3)

## 2012-04-06 LAB — APTT: aPTT: 36 seconds (ref 24–37)

## 2012-04-11 ENCOUNTER — Other Ambulatory Visit: Payer: Self-pay

## 2012-04-17 MED ORDER — DEXTROSE 5 % IV SOLN
1.5000 g | INTRAVENOUS | Status: AC
  Administered 2012-04-18: 1.5 g via INTRAVENOUS
  Filled 2012-04-17: qty 1.5

## 2012-04-18 ENCOUNTER — Ambulatory Visit (HOSPITAL_COMMUNITY): Payer: Medicare Other | Admitting: Anesthesiology

## 2012-04-18 ENCOUNTER — Inpatient Hospital Stay (HOSPITAL_COMMUNITY): Payer: Medicare Other

## 2012-04-18 ENCOUNTER — Inpatient Hospital Stay (HOSPITAL_COMMUNITY)
Admission: RE | Admit: 2012-04-18 | Discharge: 2012-04-19 | DRG: 238 | Disposition: A | Discharge status: Home / Self Care | Payer: Medicare Other | Source: Ambulatory Visit | Attending: Vascular Surgery | Admitting: Vascular Surgery

## 2012-04-18 ENCOUNTER — Encounter (HOSPITAL_COMMUNITY): Payer: Self-pay | Admitting: Anesthesiology

## 2012-04-18 ENCOUNTER — Encounter (HOSPITAL_COMMUNITY)
Admission: RE | Disposition: A | Discharge status: Home / Self Care | Payer: Self-pay | Source: Ambulatory Visit | Attending: Vascular Surgery

## 2012-04-18 DIAGNOSIS — I714 Abdominal aortic aneurysm, without rupture, unspecified: Principal | ICD-10-CM | POA: Diagnosis present

## 2012-04-18 DIAGNOSIS — I10 Essential (primary) hypertension: Secondary | ICD-10-CM | POA: Diagnosis present

## 2012-04-18 DIAGNOSIS — I2510 Atherosclerotic heart disease of native coronary artery without angina pectoris: Secondary | ICD-10-CM | POA: Diagnosis present

## 2012-04-18 DIAGNOSIS — I6529 Occlusion and stenosis of unspecified carotid artery: Secondary | ICD-10-CM

## 2012-04-18 DIAGNOSIS — K5900 Constipation, unspecified: Secondary | ICD-10-CM | POA: Diagnosis present

## 2012-04-18 DIAGNOSIS — K219 Gastro-esophageal reflux disease without esophagitis: Secondary | ICD-10-CM | POA: Diagnosis present

## 2012-04-18 DIAGNOSIS — Z7902 Long term (current) use of antithrombotics/antiplatelets: Secondary | ICD-10-CM

## 2012-04-18 DIAGNOSIS — Z9861 Coronary angioplasty status: Secondary | ICD-10-CM

## 2012-04-18 DIAGNOSIS — Z7982 Long term (current) use of aspirin: Secondary | ICD-10-CM

## 2012-04-18 DIAGNOSIS — T82898A Other specified complication of vascular prosthetic devices, implants and grafts, initial encounter: Secondary | ICD-10-CM

## 2012-04-18 DIAGNOSIS — E785 Hyperlipidemia, unspecified: Secondary | ICD-10-CM | POA: Diagnosis present

## 2012-04-18 HISTORY — DX: Frequency of micturition: R35.0

## 2012-04-18 HISTORY — DX: Diverticulosis of intestine, part unspecified, without perforation or abscess without bleeding: K57.90

## 2012-04-18 HISTORY — DX: Hyperlipidemia, unspecified: E78.5

## 2012-04-18 HISTORY — DX: Nocturia: R35.1

## 2012-04-18 HISTORY — DX: Other skin changes: R23.8

## 2012-04-18 HISTORY — DX: Unspecified osteoarthritis, unspecified site: M19.90

## 2012-04-18 HISTORY — DX: Gastro-esophageal reflux disease without esophagitis: K21.9

## 2012-04-18 HISTORY — DX: Personal history of other diseases of the digestive system: Z87.19

## 2012-04-18 HISTORY — DX: Atherosclerotic heart disease of native coronary artery without angina pectoris: I25.10

## 2012-04-18 HISTORY — DX: Spontaneous ecchymoses: R23.3

## 2012-04-18 HISTORY — DX: Constipation, unspecified: K59.00

## 2012-04-18 LAB — APTT: aPTT: 38 seconds — ABNORMAL HIGH (ref 24–37)

## 2012-04-18 LAB — BASIC METABOLIC PANEL
Chloride: 100 mEq/L (ref 96–112)
GFR calc Af Amer: >90 mL/min (ref >90)
GFR calc non Af Amer: 82 mL/min — ABNORMAL LOW (ref >90)
Glucose, Bld: 112 mg/dL — ABNORMAL HIGH (ref 70–99)
Potassium: 3.9 mEq/L (ref 3.5–5.1)
Sodium: 136 mEq/L (ref 135–145)

## 2012-04-18 LAB — MAGNESIUM: Magnesium: 2 mg/dL (ref 1.5–2.5)

## 2012-04-18 LAB — CBC
Hemoglobin: 10.7 g/dL — ABNORMAL LOW (ref 13.0–17.0)
MCHC: 33 g/dL (ref 30.0–36.0)
WBC: 6.9 10*3/uL (ref 4.0–10.5)

## 2012-04-18 LAB — URINALYSIS, ROUTINE W REFLEX MICROSCOPIC
Glucose, UA: NEGATIVE mg/dL
Leukocytes, UA: NEGATIVE
Nitrite: NEGATIVE
Specific Gravity, Urine: 1.021 (ref 1.005–1.030)
pH: 5.5 (ref 5.0–8.0)

## 2012-04-18 LAB — POCT I-STAT 4, (NA,K, GLUC, HGB,HCT)
Hemoglobin: 10.5 g/dL — ABNORMAL LOW (ref 13.0–17.0)
Potassium: 4.1 mEq/L (ref 3.5–5.1)

## 2012-04-18 LAB — PROTIME-INR: INR: 1.11 (ref 0.00–1.49)

## 2012-04-18 LAB — URINE MICROSCOPIC-ADD ON

## 2012-04-18 SURGERY — INSERTION, ENDOVASCULAR STENT GRAFT, AORTA, ABDOMINAL
Anesthesia: General | Wound class: Clean

## 2012-04-18 MED ORDER — POTASSIUM CHLORIDE CRYS ER 20 MEQ PO TBCR: 20.0000 meq | EXTENDED_RELEASE_TABLET | Freq: Once | ORAL | Status: AC | PRN

## 2012-04-18 MED ORDER — SODIUM CHLORIDE 0.9 % IV SOLN: INTRAVENOUS | Status: DC

## 2012-04-18 MED ORDER — CHLORHEXIDINE GLUCONATE CLOTH 2 % EX PADS
6.0000 | MEDICATED_PAD | Freq: Every day | CUTANEOUS | Status: DC
  Administered 2012-04-19: 6 via TOPICAL

## 2012-04-18 MED ORDER — HYDROMORPHONE HCL PF 1 MG/ML IJ SOLN
0.2500 mg | INTRAMUSCULAR | Status: DC | PRN
  Administered 2012-04-18 (×2): 0.5 mg via INTRAVENOUS

## 2012-04-18 MED ORDER — 0.9 % SODIUM CHLORIDE (POUR BTL) OPTIME
TOPICAL | Status: DC | PRN
  Administered 2012-04-18: 1000 mL

## 2012-04-18 MED ORDER — SODIUM CHLORIDE 0.9 % IV SOLN
INTRAVENOUS | Status: DC
  Administered 2012-04-18: 16:00:00 via INTRAVENOUS

## 2012-04-18 MED ORDER — NIFEDIPINE ER 60 MG PO TB24
60.0000 mg | ORAL_TABLET | Freq: Every day | ORAL | Status: DC
  Administered 2012-04-19: 60 mg via ORAL
  Filled 2012-04-18 (×2): qty 1

## 2012-04-18 MED ORDER — TRIAMTERENE-HCTZ 37.5-25 MG PO TABS
1.0000 | ORAL_TABLET | Freq: Every day | ORAL | Status: DC
  Administered 2012-04-18 – 2012-04-19 (×2): 1 via ORAL
  Filled 2012-04-18 (×2): qty 1

## 2012-04-18 MED ORDER — PANTOPRAZOLE SODIUM 40 MG PO TBEC
40.0000 mg | DELAYED_RELEASE_TABLET | Freq: Every day | ORAL | Status: DC
  Filled 2012-04-18: qty 1

## 2012-04-18 MED ORDER — BISACODYL 5 MG PO TBEC: 5.0000 mg | DELAYED_RELEASE_TABLET | Freq: Every day | ORAL | Status: DC | PRN

## 2012-04-18 MED ORDER — FENTANYL CITRATE 0.05 MG/ML IJ SOLN
INTRAMUSCULAR | Status: DC | PRN
  Administered 2012-04-18 (×5): 50 ug via INTRAVENOUS
  Administered 2012-04-18: 100 ug via INTRAVENOUS

## 2012-04-18 MED ORDER — ASPIRIN EC 325 MG PO TBEC
325.0000 mg | DELAYED_RELEASE_TABLET | Freq: Every day | ORAL | Status: DC
  Administered 2012-04-18 – 2012-04-19 (×2): 325 mg via ORAL
  Filled 2012-04-18 (×2): qty 1

## 2012-04-18 MED ORDER — PNEUMOCOCCAL VAC POLYVALENT 25 MCG/0.5ML IJ INJ
0.5000 mL | INJECTION | INTRAMUSCULAR | Status: AC
  Administered 2012-04-19: 0.5 mL via INTRAMUSCULAR
  Filled 2012-04-18: qty 0.5

## 2012-04-18 MED ORDER — LABETALOL HCL 5 MG/ML IV SOLN: 10.0000 mg | INTRAVENOUS | Status: DC | PRN

## 2012-04-18 MED ORDER — DEXTROSE 5 % IV SOLN
1.5000 g | Freq: Two times a day (BID) | INTRAVENOUS | Status: AC
  Administered 2012-04-18 – 2012-04-19 (×2): 1.5 g via INTRAVENOUS
  Filled 2012-04-18 (×2): qty 1.5

## 2012-04-18 MED ORDER — PHENOL 1.4 % MT LIQD: 1.0000 | OROMUCOSAL | Status: DC | PRN

## 2012-04-18 MED ORDER — ONDANSETRON HCL 4 MG/2ML IJ SOLN
INTRAMUSCULAR | Status: AC
  Filled 2012-04-18: qty 2

## 2012-04-18 MED ORDER — METOPROLOL SUCCINATE ER 100 MG PO TB24: 200.0000 mg | ORAL_TABLET | Freq: Every day | ORAL | Status: DC

## 2012-04-18 MED ORDER — MORPHINE SULFATE 2 MG/ML IJ SOLN
2.0000 mg | INTRAMUSCULAR | Status: DC | PRN
  Administered 2012-04-18: 2 mg via INTRAVENOUS
  Filled 2012-04-18: qty 1

## 2012-04-18 MED ORDER — LACTATED RINGERS IV SOLN
INTRAVENOUS | Status: DC | PRN
  Administered 2012-04-18: 08:00:00 via INTRAVENOUS

## 2012-04-18 MED ORDER — IODIXANOL 320 MG/ML IV SOLN
INTRAVENOUS | Status: DC | PRN
  Administered 2012-04-18: 49.8 mL via INTRAVENOUS

## 2012-04-18 MED ORDER — HYDRALAZINE HCL 20 MG/ML IJ SOLN: 10.0000 mg | INTRAMUSCULAR | Status: DC | PRN

## 2012-04-18 MED ORDER — ALUM & MAG HYDROXIDE-SIMETH 200-200-20 MG/5ML PO SUSP: 15.0000 mL | ORAL | Status: DC | PRN

## 2012-04-18 MED ORDER — CLOPIDOGREL BISULFATE 75 MG PO TABS
75.0000 mg | ORAL_TABLET | Freq: Every day | ORAL | Status: DC
  Administered 2012-04-18 – 2012-04-19 (×2): 75 mg via ORAL
  Filled 2012-04-18 (×2): qty 1

## 2012-04-18 MED ORDER — PSYLLIUM 95 % PO PACK
1.0000 | PACK | Freq: Every day | ORAL | Status: DC
  Administered 2012-04-18 – 2012-04-19 (×2): 1 via ORAL
  Filled 2012-04-18 (×2): qty 1

## 2012-04-18 MED ORDER — ONDANSETRON HCL 4 MG/2ML IJ SOLN: 4.0000 mg | Freq: Four times a day (QID) | INTRAMUSCULAR | Status: DC | PRN

## 2012-04-18 MED ORDER — METOPROLOL SUCCINATE ER 100 MG PO TB24
200.0000 mg | ORAL_TABLET | Freq: Every day | ORAL | Status: DC
  Administered 2012-04-19: 200 mg via ORAL
  Filled 2012-04-18: qty 2

## 2012-04-18 MED ORDER — MIDAZOLAM HCL 5 MG/5ML IJ SOLN
INTRAMUSCULAR | Status: DC | PRN
  Administered 2012-04-18: 2 mg via INTRAVENOUS

## 2012-04-18 MED ORDER — OXYCODONE HCL 5 MG PO TABS
ORAL_TABLET | ORAL | Status: AC
  Filled 2012-04-18: qty 2

## 2012-04-18 MED ORDER — SENNOSIDES-DOCUSATE SODIUM 8.6-50 MG PO TABS
1.0000 | ORAL_TABLET | Freq: Every evening | ORAL | Status: DC | PRN
  Filled 2012-04-18: qty 1

## 2012-04-18 MED ORDER — METOPROLOL TARTRATE 1 MG/ML IV SOLN: 2.0000 mg | INTRAVENOUS | Status: DC | PRN

## 2012-04-18 MED ORDER — PANTOPRAZOLE SODIUM 40 MG PO TBEC
40.0000 mg | DELAYED_RELEASE_TABLET | Freq: Every day | ORAL | Status: DC
  Administered 2012-04-18 – 2012-04-19 (×2): 40 mg via ORAL
  Filled 2012-04-18: qty 1

## 2012-04-18 MED ORDER — MUPIROCIN 2 % EX OINT
1.0000 "application " | TOPICAL_OINTMENT | Freq: Two times a day (BID) | CUTANEOUS | Status: DC
  Administered 2012-04-18 – 2012-04-19 (×2): 1 via NASAL
  Filled 2012-04-18: qty 22

## 2012-04-18 MED ORDER — NEOSTIGMINE METHYLSULFATE 1 MG/ML IJ SOLN
INTRAMUSCULAR | Status: DC | PRN
  Administered 2012-04-18: 4 mg via INTRAVENOUS

## 2012-04-18 MED ORDER — MAGNESIUM SULFATE 40 MG/ML IJ SOLN
2.0000 g | Freq: Once | INTRAMUSCULAR | Status: AC | PRN
  Filled 2012-04-18: qty 50

## 2012-04-18 MED ORDER — GEMFIBROZIL 600 MG PO TABS
600.0000 mg | ORAL_TABLET | Freq: Two times a day (BID) | ORAL | Status: DC
  Administered 2012-04-18 – 2012-04-19 (×2): 600 mg via ORAL
  Filled 2012-04-18 (×4): qty 1

## 2012-04-18 MED ORDER — OXYCODONE HCL 5 MG PO TABS
5.0000 mg | ORAL_TABLET | ORAL | Status: DC | PRN
  Administered 2012-04-18: 10 mg via ORAL

## 2012-04-18 MED ORDER — VECURONIUM BROMIDE 10 MG IV SOLR
INTRAVENOUS | Status: DC | PRN
  Administered 2012-04-18: 2 mg via INTRAVENOUS

## 2012-04-18 MED ORDER — GUAIFENESIN-DM 100-10 MG/5ML PO SYRP: 15.0000 mL | ORAL_SOLUTION | ORAL | Status: DC | PRN

## 2012-04-18 MED ORDER — ONDANSETRON HCL 4 MG/2ML IJ SOLN
INTRAMUSCULAR | Status: DC | PRN
  Administered 2012-04-18: 4 mg via INTRAVENOUS

## 2012-04-18 MED ORDER — PANTOPRAZOLE SODIUM 40 MG PO TBEC: 40.0000 mg | DELAYED_RELEASE_TABLET | Freq: Every day | ORAL | Status: DC

## 2012-04-18 MED ORDER — SODIUM CHLORIDE 0.9 % IR SOLN
Status: DC | PRN
  Administered 2012-04-18: 10:00:00

## 2012-04-18 MED ORDER — HEPARIN SODIUM (PORCINE) 1000 UNIT/ML IJ SOLN
INTRAMUSCULAR | Status: DC | PRN
  Administered 2012-04-18: 6000 [IU] via INTRAVENOUS

## 2012-04-18 MED ORDER — NITROGLYCERIN 0.4 MG SL SUBL: 0.4000 mg | SUBLINGUAL_TABLET | SUBLINGUAL | Status: DC | PRN

## 2012-04-18 MED ORDER — ONDANSETRON HCL 4 MG/2ML IJ SOLN
4.0000 mg | Freq: Four times a day (QID) | INTRAMUSCULAR | Status: AC | PRN
  Administered 2012-04-18: 4 mg via INTRAVENOUS

## 2012-04-18 MED ORDER — HYDROMORPHONE HCL PF 1 MG/ML IJ SOLN
INTRAMUSCULAR | Status: AC
  Filled 2012-04-18: qty 1

## 2012-04-18 MED ORDER — SODIUM CHLORIDE 0.9 % IV SOLN: 500.0000 mL | Freq: Once | INTRAVENOUS | Status: AC | PRN

## 2012-04-18 MED ORDER — PROTAMINE SULFATE 10 MG/ML IV SOLN
INTRAVENOUS | Status: DC | PRN
  Administered 2012-04-18: 50 mg via INTRAVENOUS

## 2012-04-18 MED ORDER — PROPOFOL 10 MG/ML IV EMUL
INTRAVENOUS | Status: DC | PRN
  Administered 2012-04-18: 150 mg via INTRAVENOUS

## 2012-04-18 MED ORDER — DOCUSATE SODIUM 100 MG PO CAPS
100.0000 mg | ORAL_CAPSULE | Freq: Every day | ORAL | Status: DC
Start: 2012-04-19 — End: 2012-04-19
  Administered 2012-04-19: 100 mg via ORAL
  Filled 2012-04-18: qty 1

## 2012-04-18 MED ORDER — ACETAMINOPHEN 325 MG PO TABS: 325.0000 mg | ORAL_TABLET | ORAL | Status: DC | PRN

## 2012-04-18 MED ORDER — DEXTROSE 5 % IV SOLN
10.0000 mg | INTRAVENOUS | Status: DC | PRN
  Administered 2012-04-18: 5 ug/min via INTRAVENOUS

## 2012-04-18 MED ORDER — COLESEVELAM HCL 625 MG PO TABS
2500.0000 mg | ORAL_TABLET | Freq: Two times a day (BID) | ORAL | Status: DC
  Administered 2012-04-18 – 2012-04-19 (×2): 2500 mg via ORAL
  Filled 2012-04-18 (×6): qty 4

## 2012-04-18 MED ORDER — ACETAMINOPHEN 650 MG RE SUPP: 325.0000 mg | RECTAL | Status: DC | PRN

## 2012-04-18 MED ORDER — ASPIRIN EC 81 MG PO TBEC: 500.0000 mg | DELAYED_RELEASE_TABLET | Freq: Every day | ORAL | Status: DC

## 2012-04-18 MED ORDER — GLYCOPYRROLATE 0.2 MG/ML IJ SOLN
INTRAMUSCULAR | Status: DC | PRN
  Administered 2012-04-18: .8 mg via INTRAVENOUS
  Administered 2012-04-18: 0.2 mg via INTRAVENOUS

## 2012-04-18 MED ORDER — ROCURONIUM BROMIDE 100 MG/10ML IV SOLN
INTRAVENOUS | Status: DC | PRN
  Administered 2012-04-18: 50 mg via INTRAVENOUS

## 2012-04-18 MED ORDER — DOPAMINE-DEXTROSE 3.2-5 MG/ML-% IV SOLN: 3.0000 ug/kg/min | INTRAVENOUS | Status: DC

## 2012-04-18 SURGICAL SUPPLY — 71 items
APL SKNCLS STERI-STRIP NONHPOA (GAUZE/BANDAGES/DRESSINGS) ×2
BAG DECANTER FOR FLEXI CONT (MISCELLANEOUS) IMPLANT
BAG SNAP BAND KOVER 36X36 (MISCELLANEOUS) ×3 IMPLANT
BALLN CODA OCL 2-9.0-35-120-3 (BALLOONS)
BALLOON COD OCL 2-9.0-35-120-3 (BALLOONS) IMPLANT
BENZOIN TINCTURE PRP APPL 2/3 (GAUZE/BANDAGES/DRESSINGS) ×3 IMPLANT
CANISTER SUCTION 2500CC (MISCELLANEOUS) ×2 IMPLANT
CATH BEACON 5.038 65CM KMP-01 (CATHETERS) ×1 IMPLANT
CATH OMNI FLUSH .035X70CM (CATHETERS) ×1 IMPLANT
CLIP LIGATING EXTRA MED SLVR (CLIP) ×2 IMPLANT
CLIP LIGATING EXTRA SM BLUE (MISCELLANEOUS) ×2 IMPLANT
CLOTH BEACON ORANGE TIMEOUT ST (SAFETY) ×2 IMPLANT
CLSR STERI-STRIP ANTIMIC 1/2X4 (GAUZE/BANDAGES/DRESSINGS) ×2 IMPLANT
COVER MAYO STAND STRL (DRAPES) ×2 IMPLANT
COVER SURGICAL LIGHT HANDLE (MISCELLANEOUS) ×4 IMPLANT
DEVICE CLOSURE PERCLS PRGLD 6F (VASCULAR PRODUCTS) IMPLANT
DRAIN CHANNEL 10F 3/8 F FF (DRAIN) IMPLANT
DRAIN CHANNEL 10M FLAT 3/4 FLT (DRAIN) IMPLANT
DRAPE C-ARM 42X72 X-RAY (DRAPES) ×2 IMPLANT
DRAPE TABLE COVER HEAVY DUTY (DRAPES) ×2 IMPLANT
DRESSING OPSITE X SMALL 2X3 (GAUZE/BANDAGES/DRESSINGS) ×2 IMPLANT
ELECT CAUTERY BLADE 6.4 (BLADE) ×1 IMPLANT
ELECT REM PT RETURN 9FT ADLT (ELECTROSURGICAL) ×4
ELECTRODE REM PT RTRN 9FT ADLT (ELECTROSURGICAL) ×2 IMPLANT
EVACUATOR 3/16  PVC DRAIN (DRAIN)
EVACUATOR 3/16 PVC DRAIN (DRAIN) IMPLANT
EVACUATOR SILICONE 100CC (DRAIN) IMPLANT
GLOVE BIOGEL PI IND STRL 7.0 (GLOVE) IMPLANT
GLOVE BIOGEL PI INDICATOR 7.0 (GLOVE) ×2
GLOVE SS BIOGEL STRL SZ 7.5 (GLOVE) ×1 IMPLANT
GLOVE SUPERSENSE BIOGEL SZ 7.5 (GLOVE) ×1
GOWN STRL NON-REIN LRG LVL3 (GOWN DISPOSABLE) ×8 IMPLANT
GRAFT BALLN CATH 65CM (STENTS) IMPLANT
KIT BASIN OR (CUSTOM PROCEDURE TRAY) ×2 IMPLANT
KIT ROOM TURNOVER OR (KITS) ×2 IMPLANT
LEG CONTRALATERAL 16X20X13.5 (Vascular Products) ×2 IMPLANT
LEG CONTRALATERAL 20X11.5 (Vascular Products) ×2 IMPLANT
NDL PERC 18GX7CM (NEEDLE) ×1 IMPLANT
NEEDLE PERC 18GX7CM (NEEDLE) ×6 IMPLANT
NS IRRIG 1000ML POUR BTL (IV SOLUTION) ×4 IMPLANT
PACK AORTA (CUSTOM PROCEDURE TRAY) ×2 IMPLANT
PAD ARMBOARD 7.5X6 YLW CONV (MISCELLANEOUS) ×4 IMPLANT
PENCIL BUTTON HOLSTER BLD 10FT (ELECTRODE) ×1 IMPLANT
PERCLOSE PROGLIDE 6F (VASCULAR PRODUCTS) ×12
SHEATH AVANTI 11CM 8FR (MISCELLANEOUS) ×1 IMPLANT
SHEATH BRITE TIP 8FR 23CM (MISCELLANEOUS) ×1 IMPLANT
SHEATH DRYSEAL GORE 12FRX28 (SHEATH) ×2 IMPLANT
SNAP KAP ×1 IMPLANT
STAPLER VISISTAT 35W (STAPLE) IMPLANT
STENT GRAFT BALLN CATH 65CM (STENTS) ×1
STENT GRAFT CONTRALAT 20X11.5 (Vascular Products) IMPLANT
STENT GRAFT CONTRALAT 20X13.5 (Vascular Products) IMPLANT
STOPCOCK MORSE 400PSI 3WAY (MISCELLANEOUS) ×2 IMPLANT
STRIP CLOSURE SKIN 1/2X4 (GAUZE/BANDAGES/DRESSINGS) ×2 IMPLANT
SUT ETHILON 3 0 PS 1 (SUTURE) IMPLANT
SUT PROLENE 5 0 C 1 24 (SUTURE) IMPLANT
SUT VIC AB 2-0 CTX 36 (SUTURE) ×2 IMPLANT
SUT VIC AB 3-0 SH 27 (SUTURE)
SUT VIC AB 3-0 SH 27X BRD (SUTURE) ×2 IMPLANT
SYR 20CC LL (SYRINGE) ×4 IMPLANT
SYR 30ML LL (SYRINGE) IMPLANT
SYR 5ML LL (SYRINGE) ×2 IMPLANT
SYR MEDRAD MARK V 150ML (SYRINGE) ×2 IMPLANT
SYRINGE 10CC LL (SYRINGE) ×6 IMPLANT
TOWEL OR 17X24 6PK STRL BLUE (TOWEL DISPOSABLE) ×4 IMPLANT
TOWEL OR 17X26 10 PK STRL BLUE (TOWEL DISPOSABLE) ×4 IMPLANT
TRAY FOLEY CATH 14FRSI W/METER (CATHETERS) ×2 IMPLANT
TUBING HIGH PRESSURE 120CM (CONNECTOR) ×2 IMPLANT
WATER STERILE IRR 1000ML POUR (IV SOLUTION) ×2 IMPLANT
WIRE AMPLATZ SS-J .035X180CM (WIRE) ×2 IMPLANT
WIRE BENTSON .035X145CM (WIRE) ×2 IMPLANT

## 2012-04-18 NOTE — Op Note (Signed)
OPERATIVE REPORT  DATE OF SURGERY: 04/18/2012  PATIENT: Johnny Leblanc, 78 y.o. male MRN: [id number]  DOB: 1928-09-27  PRE-OPERATIVE DIAGNOSIS: Large endoleak with expanding infrarenal abdominal aortic aneurysm  POST-OPERATIVE DIAGNOSIS:  Same  PROCEDURE: Extension of both iliac limbs of prior placed stent graft abdominal aortic aneurysm  SURGEON:  Gretta Began, M.D.  PHYSICIAN ASSISTANT: Collins  ANESTHESIA:  Gen.  EBL: 200 ml  Total I/O In: 3200 [I.V.:3200] Out: 1050 [Urine:850; Blood:200]  BLOOD ADMINISTERED: None  DRAINS: None  SPECIMEN: None  COUNTS CORRECT:  YES  PLAN OF CARE: PACU   PATIENT DISPOSITION:  PACU - hemodynamically stable  PROCEDURE DETAILS: The patient is an 78 year old gentleman who is 12 years status post Ancure stent graft repair of abdominal aortic aneurysm. This was done in [facility]. He had presented earlier with ultrasound showing expansion of his aneurysm sac. A CT scan confirmed that his aneurysm sac) to 9 cm and he had a large endoleak. He had undergone an arteriogram for further evaluation of this and it was recommended that he undergo a attempted correction of this with a stent graft repair.  Procedure in detail the patient was taken to the operating room placed supine position where the area of the both groins and abdomen were prepped and draped in usual sterile fashion. The right groin was accessed in the common femoral artery using ultrasound guidance. This was confirmed the guidewire did pass up into the level of the aortic stent graft. 2 Perclose devices were deployed at approximately 11:00 and 1:00 orientation in the standard fashion over the wire and an 8 [place] sheath was then passed over the wire. Next similarly on the left groin the common femoral artery was accessed with ultrasound guidance and again the guidewire was passed centrally and 2 Perclose devices were placed again at 11:00 and 1:00 orientation. An 8 French sheath  was then passed over the guidewire. The pigtail catheter was positioned at the level of the renal arteries. This was a marker pigtail catheter. At injection revealed the level of the flow divider from the old stent graft and the level of the hypogastric takeoff on the right. A 20 mm x 11.5 cm length extension was chosen. The Amplatz superstiff wire was then passed to the pigtail the pigtail was removed. A 12 French dry shield sheath was placed up through the old limb of the stent graft. The right limb was positioned with the proximal portion just at the flow divider and the distal portion just above the hypogastric artery takeoff. This was deployed in the delivery was removed. Next the Amplatz superstiff wire was exchanged over a catheter on the left. Again a 12 French sheath was passed over the guidewire up into the limb of the graft. A 20 mm diameter by 13.5 cm of left limb was chosen. This was positioned again with the proximal portion at the flow divider in the left above the iliac bifurcation. The left limb was deployed fully. Next the proximal and distal junction areas were order treated with the balloon dilatation. The pigtail catheter was again placed at the level suprarenal order and a projection revealed no evidence of endoleak at the completion study. The patient had been given 6000 units heparin. The sheaths were then removed and the Perclose as were tied down in the standard fashion. This was checked and there was hemostasis and therefore the guidewires were pulled. The small stab incisions and groin were closed with 3-0 subcuticular stitch. Sterile dressings were  applied and the patient was taken to the recovery in stable condition   Gretta Began, M.D. 04/18/2012 3:07 PM

## 2012-04-18 NOTE — Anesthesia Postprocedure Evaluation (Signed)
Anesthesia Post Note  Patient: Johnny Leblanc  Procedure(s) Performed: Procedure(s) (LRB): ABDOMINAL AORTIC ENDOVASCULAR STENT GRAFT (N/A)  Anesthesia type: General  Patient location: PACU  Post pain: Pain level controlled and Adequate analgesia  Post assessment: Post-op Vital signs reviewed, Patient's Cardiovascular Status Stable, Respiratory Function Stable, Patent Airway and Pain level controlled  Last Vitals:  Filed Vitals:   04/18/12 1145  BP:   Pulse: 56  Temp:   Resp: 11    Post vital signs: Reviewed and stable  Level of consciousness: awake, alert  and oriented  Complications: No apparent anesthesia complications

## 2012-04-18 NOTE — Progress Notes (Signed)
Utilization review completed.  

## 2012-04-18 NOTE — Progress Notes (Signed)
Dr.Hodierne notified regarding Pt HR of 45, no new orders, will cont to monitor

## 2012-04-18 NOTE — Transfer of Care (Signed)
Immediate Anesthesia Transfer of Care Note  Patient: Johnny Leblanc  Procedure(s) Performed: Procedure(s) (LRB): ABDOMINAL AORTIC ENDOVASCULAR STENT GRAFT (N/A)  Patient Location: PACU  Anesthesia Type: General  Level of Consciousness: awake, alert  and oriented  Airway & Oxygen Therapy: Patient Spontanous Breathing and Patient connected to face mask oxygen  Post-op Assessment: Report given to PACU RN  Post vital signs: Reviewed and stable  Complications: No apparent anesthesia complications

## 2012-04-18 NOTE — Anesthesia Preprocedure Evaluation (Signed)
Anesthesia Evaluation  Patient identified by MRN, date of birth, ID band Patient awake    Reviewed: Allergy & Precautions, H&P , NPO status , Patient's Chart, lab work & pertinent test results  Airway Mallampati: II  Neck ROM: full    Dental   Pulmonary former smoker         Cardiovascular hypertension, + CAD and + Cardiac Stents     Neuro/Psych    GI/Hepatic hiatal hernia, GERD-  ,  Endo/Other    Renal/GU      Musculoskeletal  (+) Arthritis -,   Abdominal   Peds  Hematology   Anesthesia Other Findings   Reproductive/Obstetrics                           Anesthesia Physical Anesthesia Plan  ASA: III  Anesthesia Plan: General   Post-op Pain Management:    Induction: Intravenous  Airway Management Planned: Oral ETT  Additional Equipment: Arterial line  Intra-op Plan:   Post-operative Plan: Extubation in OR  Informed Consent: I have reviewed the patients History and Physical, chart, labs and discussed the procedure including the risks, benefits and alternatives for the proposed anesthesia with the patient or authorized representative who has indicated his/her understanding and acceptance.     Plan Discussed with: CRNA and Surgeon  Anesthesia Plan Comments:         Anesthesia Quick Evaluation

## 2012-04-18 NOTE — H&P (Signed)
Patient Information       Patient Name  Sex  DOB  Johnny Leblanc, Johnny Leblanc  Male  December 17, 1927  [id number]             Consult Note signed by Larina Earthly, MD at 03/09/12 1757     Author:  Larina Earthly, MD  Service:  Vascular Surgery  Author Type:  Physician   Filed:  03/09/12 1757  Note Time:  03/09/12 1749          I was consulted urgently regarding his CT scan today documenting endoleak. I have reviewed his CT scan, discuss this with the patient and his daughter present, and I discussed this with Dr. Hazle Coca office. The patient had a stent graft repair in [facility] an approximately 2002. He has had ultrasound followup at that time. He had an ultrasound 4 days ago suggested a change in his prior studies and he was scheduled for a CT scan today. A CT scan demonstrated a 9 cm aneurysmal sac within endoleak. He was told to come immediately to the emergency department and I was consulted. The patient is completely asymptomatic. Specifically he has no abdominal or back pain. He had no new concerns other than his routine followup. He feels that his aneurysm size was proximally 4-1/2 cm when it was repaired in 2002. It appeared or his daughter recall any discussion of aneurysm sac size since then. I spoke with Dr. Benay Spice office staff and they reviewed the finding is of his ultrasound from 2010, 2011, and 2012. None of these document the sac size. They do report good flow through both limbs with no evidence of endoleak. He does have a prior history of significant coronary disease with multiple prior coronary stents. He does not have a history of peripheral aneurysm. He did have coiling of a lower pole accessory left renal artery at the time of the stent graft repair.     Past Medical History     Diagnosis  Date     .  Hypertension      .  Stented coronary artery      History     Substance Use Topics     .  Smoking status:  Never Smoker     .  Smokeless tobacco:  Not on  file     .  Alcohol Use:  No     No family history on file.  No Known Allergies  No current facility-administered medications for this encounter.  Current outpatient prescriptions:aspirin 500 MG EC tablet, Take 500 mg by mouth every 6 (six) hours as needed., Disp: , Rfl: ; clopidogrel (PLAVIX) 75 MG tablet, Take 75 mg by mouth daily., Disp: , Rfl: ; colesevelam (WELCHOL) 625 MG tablet, Take 2,500 mg by mouth 2 (two) times daily with a meal., Disp: , Rfl: ; gemfibrozil (LOPID) 600 MG tablet, Take 600 mg by mouth 2 (two) times daily before a meal., Disp: , Rfl:  metoprolol (TOPROL-XL) 200 MG 24 hr tablet, Take 200 mg by mouth daily., Disp: , Rfl: ; NIFEdipine (PROCARDIA XL/ADALAT-CC) 60 MG 24 hr tablet, Take 60 mg by mouth daily., Disp: , Rfl: ; nitroGLYCERIN (NITROSTAT) 0.4 MG SL tablet, Place 0.4 mg under the tongue every 5 (five) minutes as needed. As needed for chest pain., Disp: , Rfl: ; omeprazole (PRILOSEC) 20 MG capsule, Take 20 mg by mouth daily., Disp: , Rfl:  psyllium (  HYDROCIL/METAMUCIL) 95 % PACK, Take 1 packet by mouth daily., Disp: , Rfl: ; triamterene-hydrochlorothiazide (MAXZIDE-25) 37.5-25 MG per tablet, Take 1 tablet by mouth daily., Disp: , Rfl:  Facility-Administered Medications Ordered in Other Encounters: iohexol (OMNIPAQUE) 350 MG/ML injection 100 mL, 100 mL, Intravenous, Once PRN, Medication Radiologist, MD, 100 mL at 03/09/12 1106  BP 117/79  Pulse 87  Temp(Src) 97.9 F (36.6 C) (Oral)  Resp 16  Ht 7\' 3"  (2.21 m)  Wt 230 lb (104.327 kg)  BMI 21.36 kg/m2  SpO2 97%  Body mass index is 21.36 kg/(m^2).  Review of systems: Negative except for above  Physical exam: Well-developed well-nourished white male appearing stated age in no acute distress HEENT normal. 2+ radial pulses. 2+ femoral pulses. 2+ popliteal pulses. 2+ dorsalis pedis pulses. He is no evidence of femoral or popliteal aneurysm. Chest good breath air exchange bilaterally. Monitors normal sinus rhythm. Abdomen soft  nontender I do not feel any masses. I do not feel a pulsatile mass in his aorta. Musculoskeletal no clubbing or cyanosis. Neurologically grossly intact. Skin without ulcers or rashes.  CT scan abdomen and pelvis: This shows what appears to be a Ancure aortic stent graft. There does not appear to be a type I endoleak. There does appear to be compression of the limbs of the graft more so on the left than on the right. There is an endoleak and the sac and the maximal size is 9 cm.  Impression and plan: Enlarging abdominal aortic aneurysm sac after stent graft repair in 2002. I did not have any documentation as to prior sizes of the aneurysm. According to the patient patient and his daughter they have not had a stent graft CT scan followup since implantation. I explained to the patient that this does require elective evaluation and possible treatment since this is a large aneurysm with a large endoleak. He is discharged from the emergency department today and we will schedule him for arteriography next week to begin to evaluate the cause of his endoleak. I explained that this is not equivalent to a leaking aneurysm. They are comfortable with this discussion and will be discharged. We will see him again next week for arteriography        Addendum:  The patient has been re-examined and re-evaluated.  The patient's history and physical has been reviewed and is unchanged.    Johnny Leblanc is a 78 y.o. male is being admitted with Abdominal Aortic Aneurysm. All the risks, benefits and other treatment options have been discussed with the patient. The patient has consented to proceed with Procedure(s): ABDOMINAL AORTIC ENDOVASCULAR STENT GRAFT as a surgical intervention.  Jearld Hemp 04/18/2012 6:56 AM Vascular and Vein Surgery

## 2012-04-18 NOTE — Preoperative (Signed)
Beta Blockers   Reason not to administer Beta Blockers:Not Applicable 

## 2012-04-19 ENCOUNTER — Other Ambulatory Visit: Payer: Self-pay | Admitting: *Deleted

## 2012-04-19 ENCOUNTER — Telehealth: Payer: Self-pay | Admitting: Vascular Surgery

## 2012-04-19 DIAGNOSIS — I714 Abdominal aortic aneurysm, without rupture: Secondary | ICD-10-CM

## 2012-04-19 DIAGNOSIS — Z9889 Other specified postprocedural states: Secondary | ICD-10-CM

## 2012-04-19 LAB — CBC
MCH: 28.9 pg (ref 26.0–34.0)
MCHC: 32.9 g/dL (ref 30.0–36.0)
Platelets: 323 10*3/uL (ref 150–400)
RBC: 3.7 MIL/uL — ABNORMAL LOW (ref 4.22–5.81)

## 2012-04-19 LAB — BASIC METABOLIC PANEL
Calcium: 8.7 mg/dL (ref 8.4–10.5)
GFR calc non Af Amer: 79 mL/min — ABNORMAL LOW (ref >90)
Potassium: 3.8 mEq/L (ref 3.5–5.1)
Sodium: 133 mEq/L — ABNORMAL LOW (ref 135–145)

## 2012-04-19 MED ORDER — OXYCODONE HCL 5 MG PO TABS: 5.0000 mg | ORAL_TABLET | Freq: Four times a day (QID) | ORAL | Status: AC | PRN

## 2012-04-19 NOTE — Telephone Encounter (Signed)
Spoke with pts daughter Amy, to notify of appt. I also sent letter to patients home address, [place]

## 2012-04-19 NOTE — Telephone Encounter (Signed)
Message copied by Fredrich Birks on Thu Apr 19, 2012  4:29 PM ------      Message from: Lorin Mercy K      Created: Thu Apr 19, 2012 11:06 AM      Regarding: schedule                   ----- Message -----         From: Dara Lords, PA         Sent: 04/19/2012  10:44 AM           To: Sharee Pimple, CMA            Extension of both iliac limbs of prior placed stent graft abdominal aortic aneurysm       by TFE yesterday.            F/u in 4 weeks...will need CT Scan.            Thanks,      Lelon Mast

## 2012-04-19 NOTE — Discharge Summary (Signed)
[facility] Discharge Summary  Johnny Leblanc 1928/08/15 78 y.o. male  [id number]  Admission Date: 04/18/2012  Discharge Date: 04/19/12  Physician: Larina Earthly, MD  Admission Diagnosis: Abdominal Aortic Aneurysm   HPI:   This is a 78 y.o. male who Dr. Arbie Cookey was consulted urgently regarding his CT scan today documenting endoleak. I have reviewed his CT scan, discuss this with the patient and his daughter present, and I discussed this with Dr. Hazle Coca office. The patient had a stent graft repair in [facility] an approximately 2002. He has had ultrasound followup at that time. He had an ultrasound 4 days ago suggested a change in his prior studies and he was scheduled for a CT scan today. A CT scan demonstrated a 9 cm aneurysmal sac within endoleak. He was told to come immediately to the emergency department and I was consulted. The patient is completely asymptomatic. Specifically he has no abdominal or back pain. He had no new concerns other than his routine followup. He feels that his aneurysm size was proximally 4-1/2 cm when it was repaired in 2002. It appeared or his daughter recall any discussion of aneurysm sac size since then. I spoke with Dr. Benay Spice office staff and they reviewed the finding is of his ultrasound from 2010, 2011, and 2012. None of these document the sac size. They do report good flow through both limbs with no evidence of endoleak. He does have a prior history of significant coronary disease with multiple prior coronary stents. He does not have a history of peripheral aneurysm. He did have coiling of a lower pole accessory left renal artery at the time of the stent graft repair.    Hospital Course:  The patient was admitted to the hospital and taken to the operating room on 04/18/2012 and underwent Extension of both iliac limbs of prior placed stent graft abdominal aortic aneurysm.  The pt tolerated the procedure well and was transported to  the PACU in good condition. By POD 1, he is doing well.  He is discharged home this day.  The remainder of the hospital course consisted of increasing mobilization and increasing intake of solids without difficulty.  CBC    Component Value Date/Time   WBC 11.0* 04/19/2012 0400   RBC 3.70* 04/19/2012 0400   HGB 10.7* 04/19/2012 0400   HCT 32.5* 04/19/2012 0400   PLT 323 04/19/2012 0400   MCV 87.8 04/19/2012 0400   MCH 28.9 04/19/2012 0400   MCHC 32.9 04/19/2012 0400   RDW 13.7 04/19/2012 0400   LYMPHSABS 1.4 03/09/2012 1242   MONOABS 0.4 03/09/2012 1242   EOSABS 0.3 03/09/2012 1242   BASOSABS 0.0 03/09/2012 1242    BMET    Component Value Date/Time   NA 133* 04/19/2012 0400   K 3.8 04/19/2012 0400   CL 98 04/19/2012 0400   CO2 26 04/19/2012 0400   GLUCOSE 108* 04/19/2012 0400   BUN 15 04/19/2012 0400   CREATININE 0.86 04/19/2012 0400   CALCIUM 8.7 04/19/2012 0400   GFRNONAA 79* 04/19/2012 0400   GFRAA >90 04/19/2012 0400     Discharge Instructions:   The patient is discharged to home with extensive instructions on wound care and progressive ambulation.  They are instructed not to drive or perform any heavy lifting until returning to see the physician in his office.  Discharge Orders    Future Orders Please Complete By Expires   Resume previous diet      Driving Restrictions  Comments:   No driving for 2 weeks   Lifting restrictions      Comments:   No lifting for 6 weeks   Call MD for:  temperature >100.5      Call MD for:  redness, tenderness, or signs of infection (pain, swelling, bleeding, redness, odor or green/yellow discharge around incision site)      Call MD for:  severe or increased pain, loss or decreased feeling  in affected limb(s)      ABDOMINAL PROCEDURE/ANEURYSM REPAIR/AORTO-BIFEMORAL BYPASS:  Call MD for increased abdominal pain; cramping diarrhea; nausea/vomiting      Discharge wound care:      Comments:   Shower daily with soap and water starting 04/20/12       Discharge Diagnosis:  Abdominal Aortic Aneurysm  Secondary Diagnosis: There is no problem list on file for this patient.  Past Medical History  Diagnosis Date  . Stented coronary artery   . Hypertension     takes Maxzide,Procardia,and Metoprolol daily  . Hyperlipidemia     takes Lopid daily  . Coronary artery disease   . Arthritis   . Bruises easily     pt takes Plavix and office told them to continue his plavix and asa  . GERD (gastroesophageal reflux disease)     takes Omeprazole daily  . H/O hiatal hernia   . Constipation     takes Metamucil daily  . Diverticulosis   . Urinary frequency   . Nocturia       Tinnell, Jeovanny B  Home Medication Instructions [id number]   Printed on:04/19/12 1047  Medication Information                    metoprolol (TOPROL-XL) 200 MG 24 hr tablet Take 200 mg by mouth daily.           omeprazole (PRILOSEC) 20 MG capsule Take 20 mg by mouth daily.           colesevelam (WELCHOL) 625 MG tablet Take 2,500 mg by mouth 2 (two) times daily with a meal.           clopidogrel (PLAVIX) 75 MG tablet Take 75 mg by mouth daily.           gemfibrozil (LOPID) 600 MG tablet Take 600 mg by mouth 2 (two) times daily before a meal.           nitroGLYCERIN (NITROSTAT) 0.4 MG SL tablet Place 0.4 mg under the tongue every 5 (five) minutes as needed. As needed for chest pain.           psyllium (HYDROCIL/METAMUCIL) 95 % PACK Take 1 packet by mouth daily.           triamterene-hydrochlorothiazide (MAXZIDE-25) 37.5-25 MG per tablet Take 1 tablet by mouth daily.           NIFEdipine (PROCARDIA XL/ADALAT-CC) 60 MG 24 hr tablet Take 60 mg by mouth daily.           aspirin 325 MG EC tablet Take 325 mg by mouth daily.           oxyCODONE (OXY IR/ROXICODONE) 5 MG immediate release tablet Take 1 tablet (5 mg total) by mouth every 6 (six) hours as needed. #30 NR            Disposition: home  Patient's condition: is Good  Follow up: 1. Dr.  Arbie Cookey in 4 weeks with CT Scan.   Doreatha Massed, PA-C [facility] [phone]  04/19/2012  10:47 AM

## 2012-04-19 NOTE — Progress Notes (Signed)
Pt voiding, ambulating and eating without difficulty.  Discharge instructions given to pt and daughter.  Both verbalized understanding with all questions answered.  Pt discharged home with daughter.  Roselie Awkward, RN

## 2012-04-19 NOTE — Progress Notes (Signed)
Subjective: Interval History: none..   Objective: Vital signs in last 24 hours: Temp:  [97.6 F (36.4 C)-99.6 F (37.6 C)] 97.7 F (36.5 C) (06/20 0732) Pulse Rate:  [48-82] 79  (06/20 0335) Resp:  [10-23] 20  (06/20 0335) BP: (112-132)/(51-79) 132/54 mmHg (06/20 0335) SpO2:  [94 %-100 %] 96 % (06/20 0335) Arterial Line BP: (144-159)/(51-58) 154/55 mmHg (06/19 1415) Weight:  [224 lb 13.9 oz (102 kg)] 224 lb 13.9 oz (102 kg) (06/19 1445)  Intake/Output from previous day: 06/19 0701 - 06/20 0700 In: 5725 [P.O.:600; I.V.:5075; IV Piggyback:50] Out: 2375 [Urine:2175; Blood:200] Intake/Output this shift:    Abd benign,  groins without hematoma, 2+dp pules bilat  Lab Results:  Basename 04/19/12 0400 04/18/12 1339  WBC 11.0* 6.9  HGB 10.7* 10.7*  HCT 32.5* 32.4*  PLT 323 317   BMET  Basename 04/19/12 0400 04/18/12 1339  NA 133* 136  K 3.8 3.9  CL 98 100  CO2 26 26  GLUCOSE 108* 112*  BUN 15 17  CREATININE 0.86 0.78  CALCIUM 8.7 8.6    Studies/Results: Dg Chest 2 View  04/06/2012  *RADIOLOGY REPORT*  Clinical Data: Preoperative respiratory films.  CHEST - 2 VIEW  Comparison: CT chest abdomen and pelvis 03/09/2012.  Findings: Lungs are clear.  Heart size is normal.  No pneumothorax or pleural effusion.  Eventration right hemidiaphragm noted. Postoperative change left shoulder also noted.  IMPRESSION: No acute disease.  Original Report Authenticated By: Bernadene Bell. D'ALESSIO, M.D.   Dg Chest Portable 1 View  04/18/2012  *RADIOLOGY REPORT*  Clinical Data: Status post stent graft placement.  PORTABLE CHEST - 1 VIEW  Comparison: None.  Findings: A right IJ sheath is in place.  There is no pneumothorax. Patient is rotated to the left.  The heart size is stable.  The lung volumes are low.  Bibasilar atelectasis is evident.  IMPRESSION:  1.  Interval placement of right IJ sheath without pneumothorax. 2.  Low lung volumes and mild bibasilar atelectasis.  Original Report Authenticated  By: Jamesetta Orleans. MATTERN, M.D.   Dg Abd Portable 1v  04/18/2012  *RADIOLOGY REPORT*  Clinical Data: Status post stent graft placement.  PORTABLE ABDOMEN - 1 VIEW  Comparison: CT abdomen and pelvis 03/09/2012.  Findings: Single view abdomen demonstrates an aorto-iliac stent graft placement.   The aortoiliac stent graft is in place.  There is marked tortuosity of the left iliac artery as seen on the preoperative scan.  Embolization of the inferior left renal artery is noted.  This there is mild distention of small bowel. Degenerative changes are noted in the hips bilaterally.  IMPRESSION:  1.  Interval placement of the aorto-iliac stent graft without radiographic evidence for complication. 2.  Mild dilation of small bowel raises the possibility of an ileus.  Original Report Authenticated By: Jamesetta Orleans. MATTERN, M.D.   Anti-infectives: Anti-infectives     Start     Dose/Rate Route Frequency Ordered Stop   04/18/12 2100   cefUROXime (ZINACEF) 1.5 g in dextrose 5 % 50 mL IVPB        1.5 g 100 mL/hr over 30 Minutes Intravenous Every 12 hours 04/18/12 1452 04/19/12 2059   04/17/12 1546   cefUROXime (ZINACEF) 1.5 g in dextrose 5 % 50 mL IVPB        1.5 g 100 mL/hr over 30 Minutes Intravenous 30 min pre-op 04/17/12 1546 04/18/12 0917          Assessment/Plan: s/p Procedure(s) (LRB): ABDOMINAL AORTIC ENDOVASCULAR  STENT GRAFT (N/A) Stable pod 1, DC home with fu in 1 month with CT abd and pelvis   LOS: 1 day   Javin Nong 04/19/2012, 8:01 AM

## 2012-04-19 NOTE — Care Management Note (Signed)
    Page 1 of 1   04/19/2012     11:46:42 AM   CARE MANAGEMENT NOTE 04/19/2012  Patient:  Johnny Leblanc, Johnny Leblanc   Account Number:  [id number]  Date Initiated:  04/18/2012  Documentation initiated by:  Donn Pierini  Subjective/Objective Assessment:   Pt admitted s/p AAA repair     Action/Plan:   PTA pt lived at home with family   Anticipated DC Date:  04/20/2012   Anticipated DC Plan:  HOME/SELF CARE      DC Planning Services  CM consult      Choice offered to / List presented to:             Status of service:  Completed, signed off Medicare Important Message given?   (If response is "NO", the following Medicare IM given date fields will be blank) Date Medicare IM given:   Date Additional Medicare IM given:    Discharge Disposition:  HOME/SELF CARE  Per UR Regulation:  Reviewed for med. necessity/level of care/duration of stay  If discussed at Long Length of Stay Meetings, dates discussed:    Comments:  PCP- [facility]  04/19/12- 1100- Donn Pierini RN, BSN [phone] Pt for discharge home today, no needs post op  04/18/12- 1600- Donn Pierini RN BSN [phone] UR completed, pt admitted from PACU today, NCM to follow for d/c needs

## 2012-04-20 ENCOUNTER — Telehealth: Payer: Self-pay | Admitting: Vascular Surgery

## 2012-04-20 NOTE — Telephone Encounter (Signed)
Message copied by Fredrich Birks on Fri Apr 20, 2012  9:34 AM ------      Message from: [place], [place] K      Created: Thu Apr 19, 2012 11:06 AM      Regarding: schedule                   ----- Message -----         From: Dara Lords, PA         Sent: 04/19/2012  10:44 AM           To: Sharee Pimple, CMA            Extension of both iliac limbs of prior placed stent graft abdominal aortic aneurysm       by [facility]FE yesterday.            F/u in 4 weeks...will need CT Scan.            Thanks,      Lelon Mast

## 2012-04-20 NOTE — Telephone Encounter (Signed)
Spoke with pts daughter about appt, she is aware, and I have mailed all instructions, dpm

## 2012-05-21 ENCOUNTER — Encounter: Payer: Self-pay | Admitting: Vascular Surgery

## 2012-05-22 ENCOUNTER — Ambulatory Visit (INDEPENDENT_AMBULATORY_CARE_PROVIDER_SITE_OTHER): Payer: Medicare Other | Admitting: Vascular Surgery

## 2012-05-22 ENCOUNTER — Ambulatory Visit
Admission: RE | Admit: 2012-05-22 | Discharge: 2012-05-22 | Disposition: A | Discharge status: Home / Self Care | Payer: Medicare Other | Source: Ambulatory Visit | Attending: Physician Assistant | Admitting: Physician Assistant

## 2012-05-22 ENCOUNTER — Encounter: Payer: Self-pay | Admitting: Vascular Surgery

## 2012-05-22 VITALS — BP 135/74 | HR 68 | Resp 20 | Ht 75.5 in | Wt 226.0 lb

## 2012-05-22 DIAGNOSIS — Z48812 Encounter for surgical aftercare following surgery on the circulatory system: Secondary | ICD-10-CM

## 2012-05-22 DIAGNOSIS — I714 Abdominal aortic aneurysm, without rupture: Secondary | ICD-10-CM

## 2012-05-22 DIAGNOSIS — Z8679 Personal history of other diseases of the circulatory system: Secondary | ICD-10-CM

## 2012-05-22 MED ORDER — IOHEXOL 350 MG/ML SOLN
100.0000 mL | Freq: Once | INTRAVENOUS | Status: AC | PRN
  Administered 2012-05-22: 100 mL via INTRAVENOUS

## 2012-05-22 NOTE — Addendum Note (Signed)
Addended by: Sharee Pimple on: 05/22/2012 02:11 PM   Modules accepted: Orders

## 2012-05-22 NOTE — Progress Notes (Signed)
History today for followup of extension of both iliac limbs of the prior placed and grabbed by myself on 04/18/2012. He had a prior Ancure graft placed in [facility] 12 years ago. Followup in shown progression of aneurysm size up to 9 cm with a large endoleak. Uneventful extension of both iliac limbs with a good result. He is here today for followup.  Exam reveals well-healed groin punctures on both sides. He has palpable popliteal pulses bilaterally. Abdomen is totally benign.  CT scan today shows excellent exclusion of his endoleak with no evidence of expansion of his aneurysm sac size.   Impression and plan: Successful treatment of a large type I endoleak distal attachment site. He will follow up with [place] again in 6 months with repeat CT scan

## 2012-10-16 IMAGING — CR DG ABD PORTABLE 1V
1 series · 1 of 1 positions shown · non-contrast
Comparison: CT abdomen and pelvis 03/09/2012.

CLINICAL DATA: Status post stent graft placement.

PORTABLE ABDOMEN - 1 VIEW

[AP]
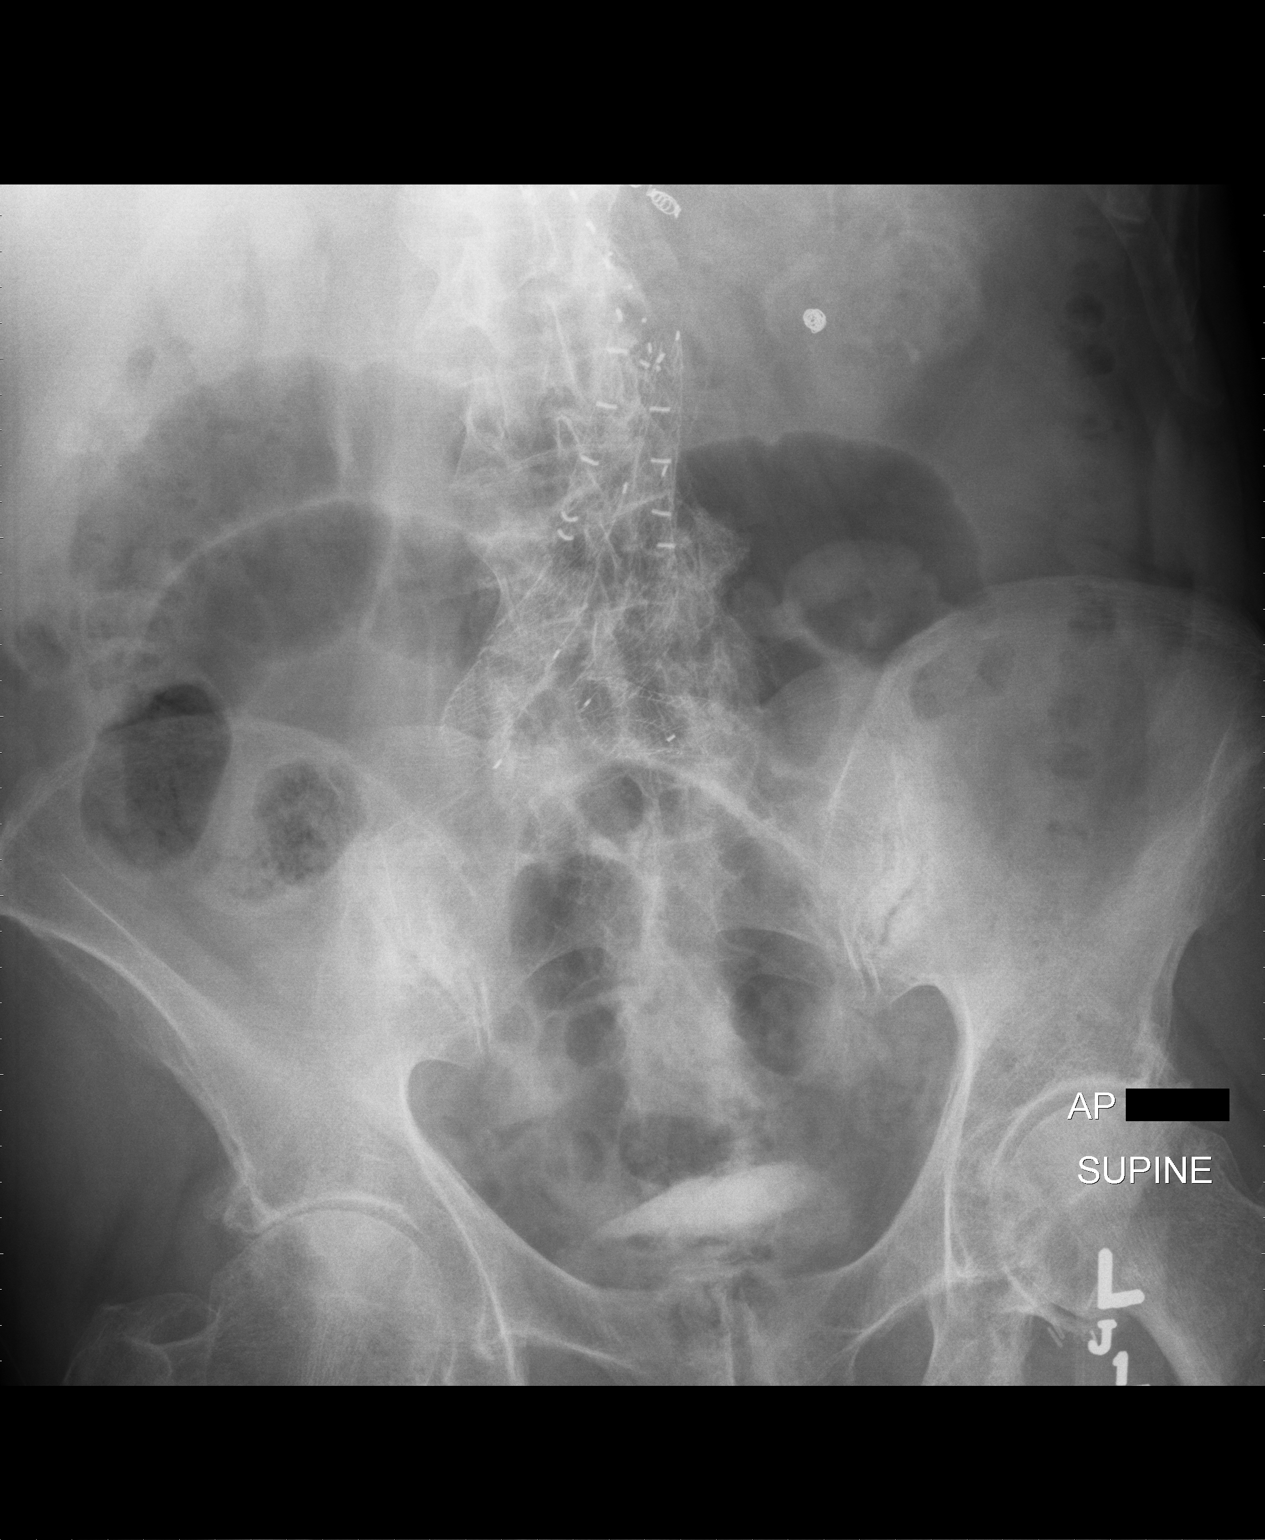

[1 of 1 positions shown; findings below may reference images not displayed]

FINDINGS: Single view abdomen demonstrates an aorto-iliac stent
graft placement.   The aortoiliac stent graft is in place.  There
is marked tortuosity of the left iliac artery as seen on the
preoperative scan.  Embolization of the inferior left renal artery
is noted.  This there is mild distention of small bowel.
Degenerative changes are noted in the hips bilaterally.
IMPRESSION: 1.  Interval placement of the aorto-iliac stent graft without
radiographic evidence for complication.
2.  Mild dilation of small bowel raises the possibility of an
ileus.

## 2012-11-19 ENCOUNTER — Encounter: Payer: Self-pay | Admitting: Vascular Surgery

## 2012-11-20 ENCOUNTER — Ambulatory Visit (INDEPENDENT_AMBULATORY_CARE_PROVIDER_SITE_OTHER): Payer: Medicare PPO | Admitting: Vascular Surgery

## 2012-11-20 ENCOUNTER — Ambulatory Visit
Admission: RE | Admit: 2012-11-20 | Discharge: 2012-11-20 | Disposition: A | Discharge status: Home / Self Care | Payer: Medicare PPO | Source: Ambulatory Visit | Attending: Vascular Surgery | Admitting: Vascular Surgery

## 2012-11-20 ENCOUNTER — Encounter: Payer: Self-pay | Admitting: Vascular Surgery

## 2012-11-20 VITALS — BP 147/81 | HR 68 | Resp 20 | Ht 75.0 in | Wt 222.0 lb

## 2012-11-20 DIAGNOSIS — I714 Abdominal aortic aneurysm, without rupture, unspecified: Secondary | ICD-10-CM

## 2012-11-20 DIAGNOSIS — Z48812 Encounter for surgical aftercare following surgery on the circulatory system: Secondary | ICD-10-CM

## 2012-11-20 MED ORDER — IOHEXOL 350 MG/ML SOLN
100.0000 mL | Freq: Once | INTRAVENOUS | Status: AC | PRN
  Administered 2012-11-20: 100 mL via INTRAVENOUS

## 2012-11-20 NOTE — Progress Notes (Signed)
The patient presents today for followup of stent graft repair of abdominal aortic aneurysm. He initially had an Ancure graft placed in [facility] in 2001. He had persistent distal endoleak and persistent expansion of his aneurysm sac. In June 2013 he had extension of his graft from his existing stent graft into both iliac arteries. He continues to have no symptoms related to this. He does look quite good today and reports that he is able to his usual activities with no limitation. He is here today with his daughter.  Past Medical History  Diagnosis Date  . Stented coronary artery   . Hypertension     takes Maxzide,Procardia,and Metoprolol daily  . Hyperlipidemia     takes Lopid daily  . Coronary artery disease   . Arthritis   . Bruises easily     pt takes Plavix and office told them to continue his plavix and asa  . GERD (gastroesophageal reflux disease)     takes Omeprazole daily  . H/O hiatal hernia   . Constipation     takes Metamucil daily  . Diverticulosis   . Urinary frequency   . Nocturia     History  Substance Use Topics  . Smoking status: Former Smoker    Types: Cigarettes    Quit date: 05/22/1981  . Smokeless tobacco: Never Used     Comment: quit 30+yrs ago  . Alcohol Use: No    Family History  Problem Relation Age of Onset  . Aneurysm Father   . Aneurysm Brother     No Known Allergies  Current outpatient prescriptions:aspirin 325 MG EC tablet, Take 325 mg by mouth daily., Disp: , Rfl: ;  clopidogrel (PLAVIX) 75 MG tablet, Take 75 mg by mouth daily., Disp: , Rfl: ;  colesevelam (WELCHOL) 625 MG tablet, Take 2,500 mg by mouth 2 (two) times daily with a meal., Disp: , Rfl: ;  gemfibrozil (LOPID) 600 MG tablet, Take 600 mg by mouth 2 (two) times daily before a meal., Disp: , Rfl:  metoprolol (TOPROL-XL) 200 MG 24 hr tablet, Take 200 mg by mouth daily., Disp: , Rfl: ;  NIFEdipine (PROCARDIA XL/ADALAT-CC) 60 MG 24 hr tablet, Take 60 mg by mouth daily.,  Disp: , Rfl: ;  nitroGLYCERIN (NITROSTAT) 0.4 MG SL tablet, Place 0.4 mg under the tongue every 5 (five) minutes as needed. As needed for chest pain., Disp: , Rfl: ;  omeprazole (PRILOSEC) 20 MG capsule, Take 20 mg by mouth daily., Disp: , Rfl:  psyllium (HYDROCIL/METAMUCIL) 95 % PACK, Take 1 packet by mouth daily., Disp: , Rfl: ;  triamterene-hydrochlorothiazide (MAXZIDE-25) 37.5-25 MG per tablet, Take 1 tablet by mouth daily., Disp: , Rfl:   BP 147/81  Pulse 68  Resp 20  Ht 6\' 3"  (1.905 m)  Wt 222 lb (100.699 kg)  BMI 27.75 kg/m2  Body mass index is 27.75 kg/(m^2).       Physical exam: Well-developed well-nourished white male in no acute distress Respirations nonlabored Abdomen soft nontender no pulsatile mass noted 2+ femoral and 2+ popliteal pulses with no evidence of popliteal aneurysm Skin without ulcers or rashes  I reviewed his CT scan from today. This shows no evidence of endoleak. It looks as though his aneurysm cysts several millimeters smaller than when last imaged in July 2013.  Impression and plan: Successful stent graft repair of type I endoleak. Patient will continue his usual activities. We will see him again in one year with a repeat CT scan for continued followup

## 2012-11-21 ENCOUNTER — Other Ambulatory Visit: Payer: Self-pay | Admitting: *Deleted

## 2012-11-21 DIAGNOSIS — Z48812 Encounter for surgical aftercare following surgery on the circulatory system: Secondary | ICD-10-CM

## 2012-11-21 DIAGNOSIS — I714 Abdominal aortic aneurysm, without rupture: Secondary | ICD-10-CM

## 2013-04-11 ENCOUNTER — Encounter: Payer: Self-pay | Admitting: Cardiovascular Disease

## 2013-05-20 IMAGING — CT CT CTA ABD/PEL W/CM AND/OR W/O CM
1 of 9 series · 10 of 46 positions shown, 16 images · IV contrast ([ID] [id number])
Comparison: 05/22/2012

CLINICAL DATA: Post Endograft therapy of abdominal aneurysm on
03/12/2012

CT ANGIOGRAPHY ABDOMEN AND PELVIS
TECHNIQUE: Multidetector CT imaging of the abdomen and pelvis was
performed using the standard protocol during bolus administration
of intravenous contrast.  Multiplanar reconstructed images
including MIPs were obtained and reviewed to evaluate the vascular
anatomy.
Contrast: 100mL OMNIPAQUE IOHEXOL 350 MG/ML SOLN

[Series 5: angio · axial · 0.82mm/px · z∈[-425,-22]mm · 10 of 264 slices shown, 16 images]
[im 24/264  soft-tissue]
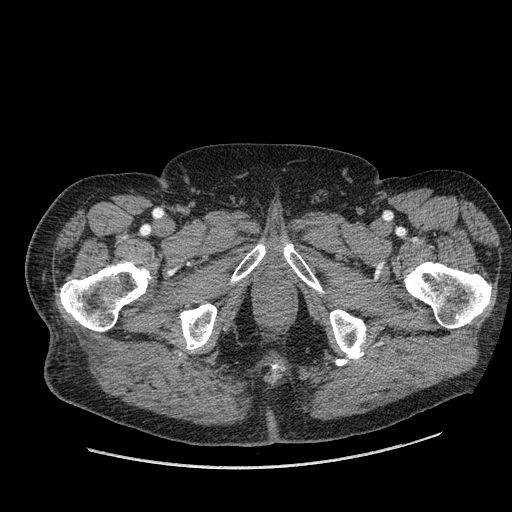
[im 24/264  bone]
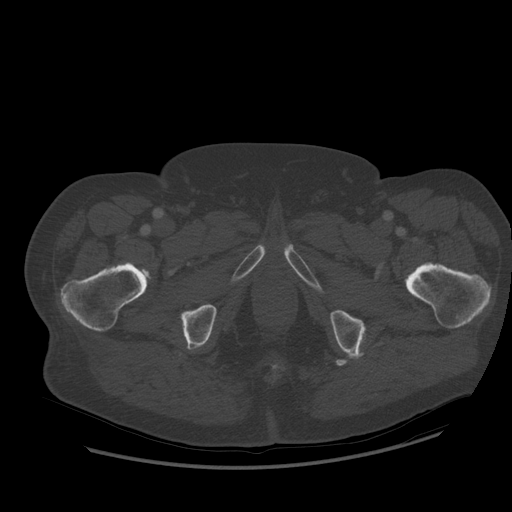
[im 48/264  soft-tissue]
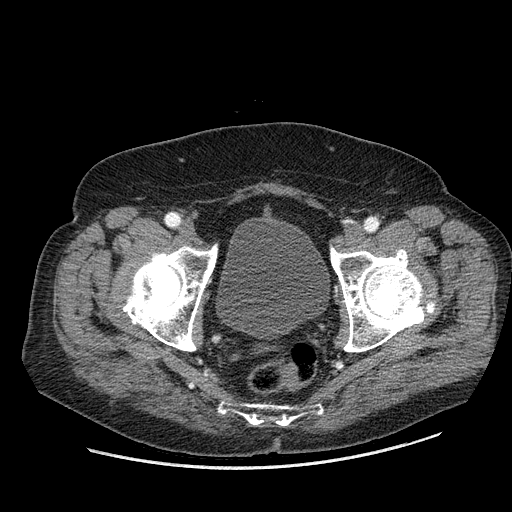
[im 72/264  soft-tissue]
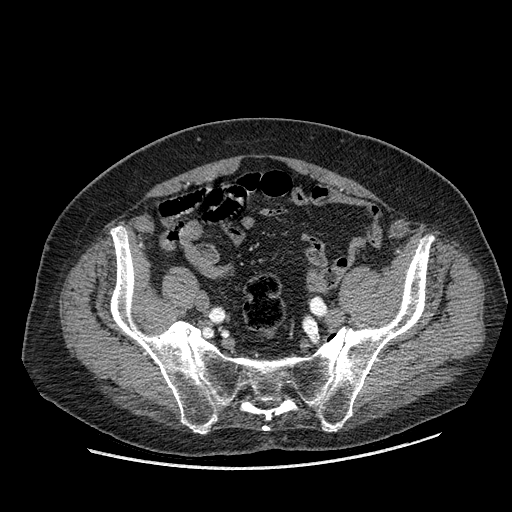
[im 96/264  soft-tissue]
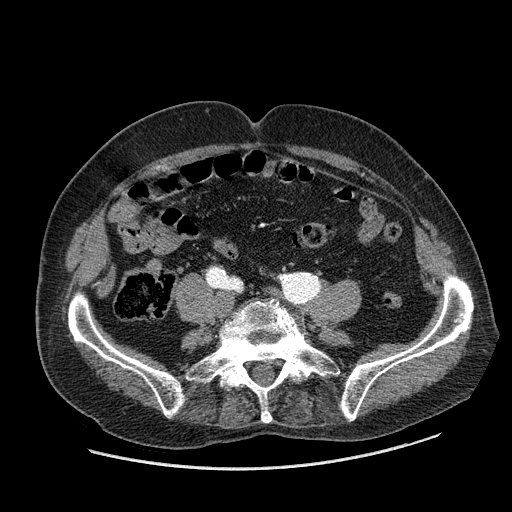
[im 120/264  soft-tissue]
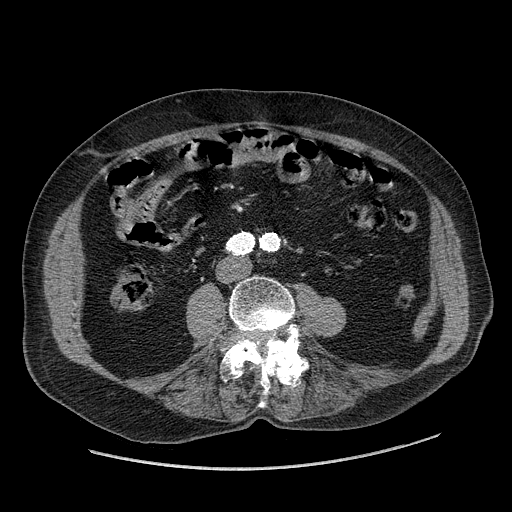
[im 144/264  soft-tissue]
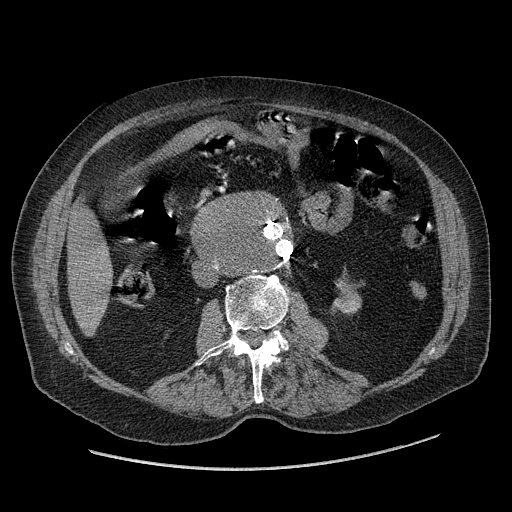
[im 168/264  soft-tissue]
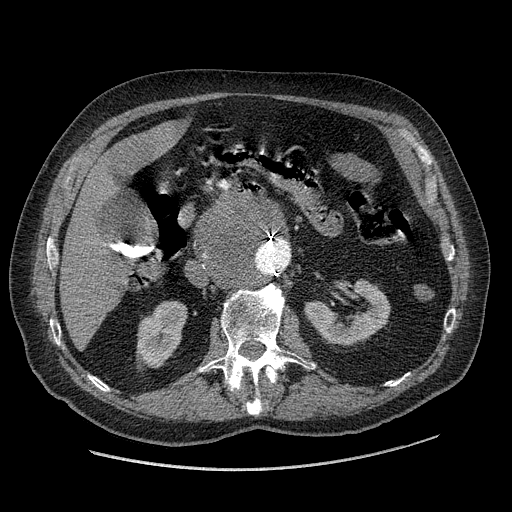
[im 168/264  lung]
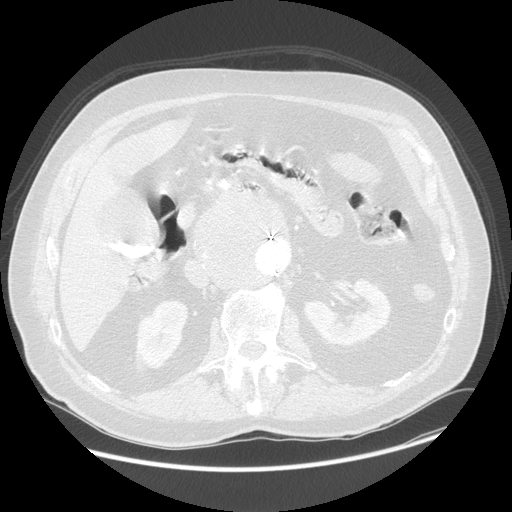
[im 192/264  soft-tissue]
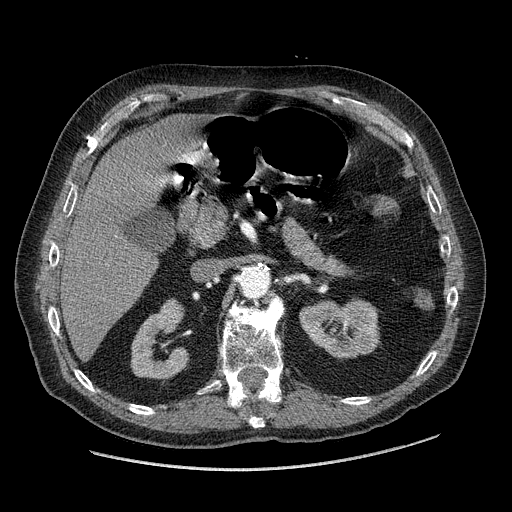
[im 192/264  lung]
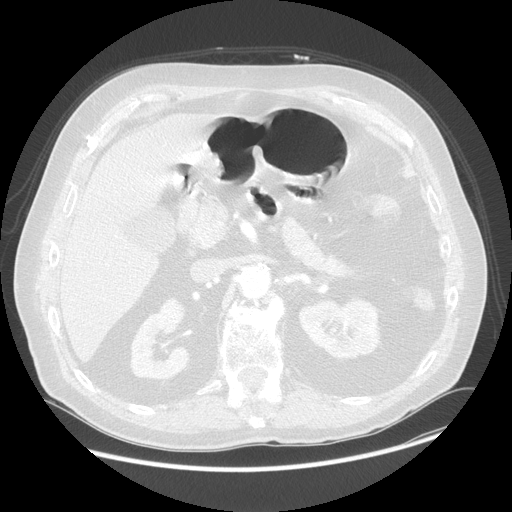
[im 216/264  soft-tissue]
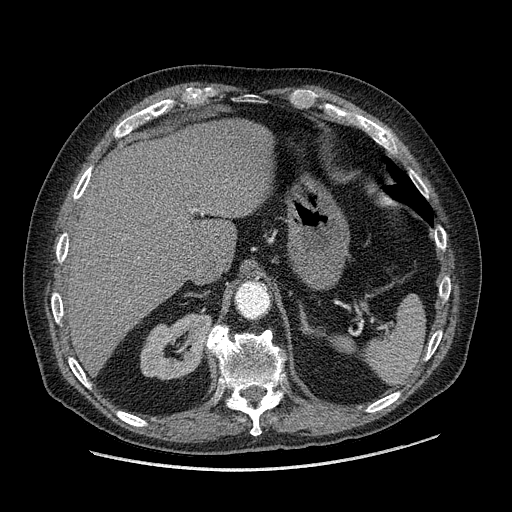
[im 216/264  lung]
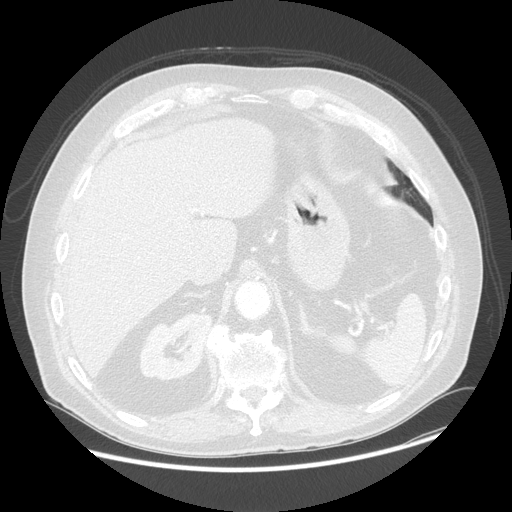
[im 216/264  bone]
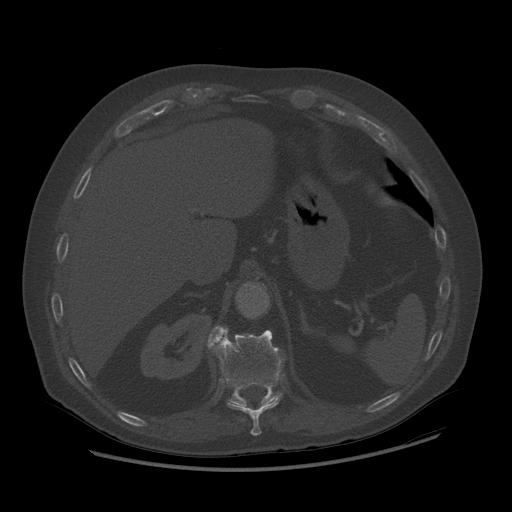
[im 240/264  soft-tissue]
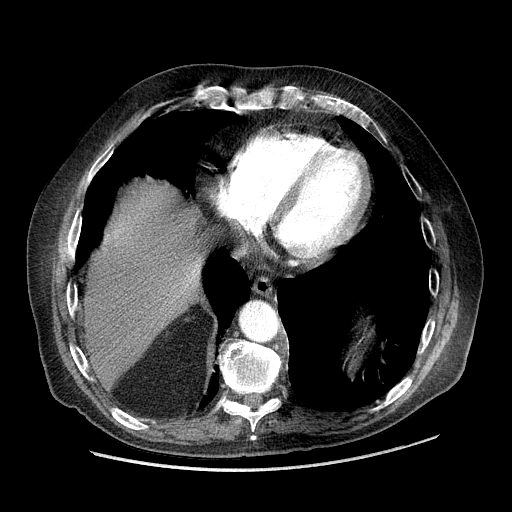
[im 240/264  lung]
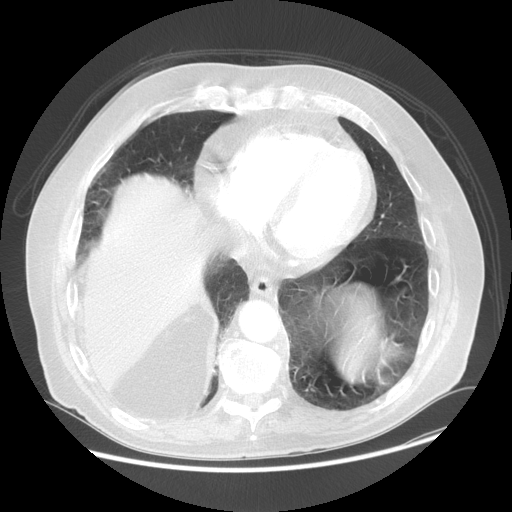

[10 of 46 positions shown; findings below may reference images not displayed]

FINDINGS: The lung bases are well-aerated with some very minimal
atelectasis in the right posterior lung base new from prior exam.
Heavy coronary calcifications are noted.

The liver, spleen, adrenal glands, pancreas are all normal on the
CT appearance.  There are embolization coils near the lower pole of
the left renal artery.  There are atrophic changes in the lower
pole left kidney.  The right kidney is within normal limits.  The
gallbladder demonstrates multiple gallstones and is stable. The
bowel structures are stable from prior exam..  The bladder is
distended with urine.  Stable anterolisthesis of L4 on L5 is noted
with subsequent spinal stenosis.

 The celiac axis and superior mesenteric artery are patent and
stable from prior exam..  Inferior mesenteric artery is well
opacified distally although it is proximal portion appears occluded
secondary to the stent graft placement. Coils are also noted near
the origin of the inferior mesenteric artery likely related to
prior embolization. These are stable in appearance from prior exam.

Dual renal arteries are noted on the right and a single renal
artery on the left.  Both demonstrate some calcific plaque with
mild persistent stenosis of the left renal artery..

There is a stent graft identified within the abdominal aorta just
below the renal arteries. No proximal or distal Endo leak is
identified.  The graft limbs are patent.  Stents are now seen
within the iliac limbs similar to that noted on the prior exam.
The aneurysm itself measures 8.9 x 7.6 cm in greatest transverse
and AP dimensions at the level of the superior aspect of the
previously placed stents within the limbs of the Endo graft.  This
is a decrease in size from prior examination at which time it
measured 9.3 x 8.6 cm in greatest transverse and AP dimensions.
The small collection of contrast material adjacent to the limbs of
the stent graft is no longer identified.  The area in the right
posterior aspect of the excluded aortic aneurysm is also not well
visualized on this exam.  Additionally the decrease in size from
prior exam would indicate a lack of active endoleak.

The iliac limbs continues into the common iliac arteries
bilaterally.  There is evidence of aneurysmal dilatation of the
distal common iliac artery on the left measuring 2.3 cm and is
stable from prior exam.  The iliac arteries and common femoral
arteries distally within normal limits.  No pseudoaneurysm is noted
at the groins.  Postsurgical changes are seen in the inguinal
region..

 Review of the MIP images confirms the above findings.
IMPRESSION: Findings consistent with a known Endograft repair with subsequent
stent placement.  No residual endoleak is identified and there is
been slight decrease in the size of the native aorta when compared
with the prior exam.

Findings consistent with prior embolization of what appears to be
the lower pole renal artery on the left.

Stable cholelithiasis.

Stable left common iliac artery aneurysm.

Stable anterolisthesis of L4 on L5 with severe spinal stenosis.

## 2013-11-26 ENCOUNTER — Ambulatory Visit: Payer: Medicare PPO | Admitting: Vascular Surgery

## 2013-11-26 ENCOUNTER — Other Ambulatory Visit: Payer: Medicare PPO

## 2013-12-17 ENCOUNTER — Ambulatory Visit: Payer: Medicare PPO | Admitting: Vascular Surgery

## 2013-12-23 ENCOUNTER — Encounter: Payer: Self-pay | Admitting: Vascular Surgery

## 2013-12-24 ENCOUNTER — Ambulatory Visit (INDEPENDENT_AMBULATORY_CARE_PROVIDER_SITE_OTHER): Payer: Medicare PPO | Admitting: Vascular Surgery

## 2013-12-24 ENCOUNTER — Encounter: Payer: Self-pay | Admitting: Vascular Surgery

## 2013-12-24 ENCOUNTER — Ambulatory Visit
Admission: RE | Admit: 2013-12-24 | Discharge: 2013-12-24 | Disposition: A | Discharge status: Home / Self Care | Payer: Medicare PPO | Source: Ambulatory Visit | Attending: Vascular Surgery | Admitting: Vascular Surgery

## 2013-12-24 VITALS — BP 124/82 | HR 85 | Ht 75.0 in | Wt 231.8 lb

## 2013-12-24 DIAGNOSIS — I714 Abdominal aortic aneurysm, without rupture, unspecified: Secondary | ICD-10-CM

## 2013-12-24 DIAGNOSIS — Z48812 Encounter for surgical aftercare following surgery on the circulatory system: Secondary | ICD-10-CM

## 2013-12-24 LAB — CREATININE, SERUM: Creat: 1.02 mg/dL (ref 0.50–1.35)

## 2013-12-24 LAB — BUN: BUN: 24 mg/dL — ABNORMAL HIGH (ref 6–23)

## 2013-12-24 MED ORDER — IOHEXOL 350 MG/ML SOLN
100.0000 mL | Freq: Once | INTRAVENOUS | Status: AC | PRN
  Administered 2013-12-24: 100 mL via INTRAVENOUS

## 2013-12-24 NOTE — Addendum Note (Signed)
Addended by: Reola Calkins on: 12/24/2013 04:14 PM   Modules accepted: Orders

## 2013-12-24 NOTE — Progress Notes (Addendum)
Patient name: Johnny Leblanc MRN: [id number] DOB: May 01, 1928 Sex: male   Referred by: Micheal Likens  Reason for referral:  Chief Complaint  Patient presents with  . AAA    1 year f/u w/ CTA prior    HISTORY OF PRESENT ILLNESS: Patient is an 80 year old gentleman with a prior history of stent graft repair of abdominal aortic aneurysm. This was initially accomplished in 2001 in [facility] with an Ancure stent graft. He had followup showing endoleak with type I flow from a iliac artery. He underwent repair of this by myself in June of 2013 with extension of both limbs of the prior placed stent grafts. He has continued to do well. He reports that he is slowing down a bit in general but no new medical problems  Past Medical History  Diagnosis Date  . Stented coronary artery   . Hypertension     takes Maxzide,Procardia,and Metoprolol daily  . Hyperlipidemia     takes Lopid daily  . Coronary artery disease   . Arthritis   . Bruises easily     pt takes Plavix and office told them to continue his plavix and asa  . GERD (gastroesophageal reflux disease)     takes Omeprazole daily  . H/O hiatal hernia   . Constipation     takes Metamucil daily  . Diverticulosis   . Urinary frequency   . Nocturia     Past Surgical History  Procedure Laterality Date  . Coronary angioplasty  90's/2003/2013    4  . Cataract surgery      bilateral  . Colonoscopy      History   Social History  . Marital Status: Widowed    Spouse Name: N/A    Number of Children: N/A  . Years of Education: N/A   Occupational History  . Not on file.   Social History Main Topics  . Smoking status: Former Smoker    Types: Cigarettes    Quit date: 05/22/1981  . Smokeless tobacco: Never Used     Comment: quit 30+yrs ago  . Alcohol Use: No  . Drug Use: No  . Sexual Activity: No   Other Topics Concern  . Not on file   Social History Narrative  . No narrative on file    Family History  Problem  Relation Age of Onset  . Aneurysm Father   . Heart attack Father   . Aneurysm Brother   . Diabetes Brother   . Cancer Mother     Allergies as of 12/24/2013  . (No Known Allergies)    Current Outpatient Prescriptions on File Prior to Visit  Medication Sig Dispense Refill  . aspirin 325 MG EC tablet Take 325 mg by mouth daily.      . clopidogrel (PLAVIX) 75 MG tablet Take 75 mg by mouth daily.      . colesevelam (WELCHOL) 625 MG tablet Take 2,500 mg by mouth 2 (two) times daily with a meal.      . gemfibrozil (LOPID) 600 MG tablet Take 600 mg by mouth 2 (two) times daily before a meal.      . metoprolol (TOPROL-XL) 200 MG 24 hr tablet Take 200 mg by mouth daily.      Marland Kitchen NIFEdipine (PROCARDIA XL/ADALAT-CC) 60 MG 24 hr tablet Take 60 mg by mouth daily.      . nitroGLYCERIN (NITROSTAT) 0.4 MG SL tablet Place 0.4 mg under the tongue every 5 (five) minutes as needed. As  needed for chest pain.      Marland Kitchen omeprazole (PRILOSEC) 20 MG capsule Take 20 mg by mouth daily.      . psyllium (HYDROCIL/METAMUCIL) 95 % PACK Take 1 packet by mouth daily.      Marland Kitchen triamterene-hydrochlorothiazide (MAXZIDE-25) 37.5-25 MG per tablet Take 1 tablet by mouth daily.       No current facility-administered medications on file prior to visit.       PHYSICAL EXAMINATION:  General: The patient is a well-nourished male, in no acute distress. Vital signs are BP 124/82  Pulse 85  Ht 6\' 3"  (1.905 m)  Wt 231 lb 12.8 oz (105.144 kg)  BMI 28.97 kg/m2  SpO2 100% Pulmonary: There is a good air exchange bilaterally  Abdomen: Soft and non-tender with normal pitch bowel sounds. No aneurysm palpable Musculoskeletal: There are no major deformities.  There is no significant extremity pain. Neurologic: No focal weakness or paresthesias are detected, Skin: There are no ulcer or rashes noted. Psychiatric: The patient has normal affect. Cardiovascular: There is a regular rate and rhythm without significant murmur appreciated. 2+  femoral and 2+ popliteal pulses without evidence of peripheral aneurysms  CT scan from today was reviewed and discussed with the patient and his daughter present. This shows no change in the maximal size. There is a persistent type II endoleak but no evidence of type I leak and no evidence of aneurysm expansion.  Impression and Plan:  Stable repair of abdominal aortic aneurysm with stent graft. He will continue his usual activities. We will see him again in one year with repeat CT scan.    EARLY, TODD [facility] [place] Office: [phone]

## 2014-08-15 ENCOUNTER — Other Ambulatory Visit: Payer: Self-pay

## 2014-10-09 ENCOUNTER — Encounter (HOSPITAL_COMMUNITY): Payer: Self-pay | Admitting: Vascular Surgery

## 2014-12-30 ENCOUNTER — Ambulatory Visit: Payer: Medicare PPO | Admitting: Vascular Surgery

## 2014-12-30 ENCOUNTER — Other Ambulatory Visit: Payer: Medicare PPO

## 2015-01-12 ENCOUNTER — Encounter: Payer: Self-pay | Admitting: Vascular Surgery

## 2015-01-12 ENCOUNTER — Other Ambulatory Visit: Payer: Self-pay

## 2015-01-12 DIAGNOSIS — I714 Abdominal aortic aneurysm, without rupture, unspecified: Secondary | ICD-10-CM

## 2015-01-13 ENCOUNTER — Ambulatory Visit (INDEPENDENT_AMBULATORY_CARE_PROVIDER_SITE_OTHER): Payer: Medicare PPO | Admitting: Vascular Surgery

## 2015-01-13 ENCOUNTER — Encounter: Payer: Self-pay | Admitting: Vascular Surgery

## 2015-01-13 ENCOUNTER — Ambulatory Visit
Admission: RE | Admit: 2015-01-13 | Discharge: 2015-01-13 | Disposition: A | Discharge status: Home / Self Care | Payer: Medicare PPO | Source: Ambulatory Visit | Attending: Vascular Surgery | Admitting: Vascular Surgery

## 2015-01-13 ENCOUNTER — Other Ambulatory Visit: Payer: Self-pay

## 2015-01-13 VITALS — BP 146/83 | HR 68 | Temp 98.7°F | Resp 16 | Ht 75.5 in | Wt 230.0 lb

## 2015-01-13 DIAGNOSIS — I714 Abdominal aortic aneurysm, without rupture, unspecified: Secondary | ICD-10-CM

## 2015-01-13 DIAGNOSIS — Z48812 Encounter for surgical aftercare following surgery on the circulatory system: Secondary | ICD-10-CM

## 2015-01-13 MED ORDER — IOPAMIDOL (ISOVUE-370) INJECTION 76%
75.0000 mL | Freq: Once | INTRAVENOUS | Status: AC | PRN
  Administered 2015-01-13: 75 mL via INTRAVENOUS

## 2015-01-13 NOTE — Progress Notes (Signed)
    Established EVAR  History of Present Illness  Johnny Leblanc is a 81 y.o. (1928/03/01) male who presents for routine follow up s/p extension of both iliac limbs due to large endoleak with expanding AAA in 2013. His prior stent graft repair was performed in [place] in 2001. Most recent CTA (Date: 12/24/13) demonstrates: type II  endoleak and stable sac size.  The patient has not had back or abdominal pain.  The patient's PMH, PSH, SH, FamHx, Med, and Allergies are unchanged from 12/24/13.  On ROS, he denies any claudication or pain with his lower extremities. He is able to accomplish his daily activities.   Physical Examination  Filed Vitals:   01/13/15 1357  BP: 146/83  Pulse: 68  Temp: 98.7 F (37.1 C)  TempSrc: Oral  Resp: 16  Height: 6' 3.5" (1.918 m)  Weight: 230 lb (104.327 kg)  SpO2: 98%   Body mass index is 28.36 kg/(m^2).  General: A&O x 3, WDWN male in NAD  Pulmonary: Sym exp, good air movt, CTAB, no rales, rhonchi, & wheezing  Cardiac: RRR, Nl S1, S2, no Murmurs, rubs or gallops  Vascular: Palpable dorsalis pedis pulses bilaterally   Musculoskeletal: M/S 5/5 throughout. Extremities without ischemic changes.  Neurologic: Pain and light touch intact in extremities  Non-Invasive Vascular Imaging  CTA Abd/Pelvis Duplex (Date: 01/13/2015)  AAA sac size: 9.1 cm x 8.1 cam  Type II endoleak detected  Medical Decision Making  Johnny Leblanc is a 81 y.o. male who presents s/p endovascular stent graft repair (2001) with extension of both iliac limbs in 2013.  Patient is asymptomatic with stable sac size. The type II endoleak from one year ago is less conspicuous on this year's CTA. 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 The patient is doing well and will follow up in one with duplex study.   Virgina Jock, PA-C [facility] [place] Office: [phone] Pager: [phone]  01/13/2015, 2:29 PM  This patient was seen  in conjunction with Dr. Donnetta Hutching    I have examined the patient, reviewed and agree with above. Continues to do very well from a activity standpoint. Review his CT scan which shows a large aneurysm with no increase in size. No significant endoleak. Possible small type II endoleak. We'll see him again in one year with ultrasound to rule out enlargement of the aneurysm sac size  EARLY, TODD, MD 01/13/2015 3:16 PM

## 2015-01-28 ENCOUNTER — Encounter: Payer: Self-pay | Admitting: Family Medicine

## 2015-03-06 DIAGNOSIS — Z947 Corneal transplant status: Secondary | ICD-10-CM | POA: Diagnosis not present

## 2015-04-27 ENCOUNTER — Other Ambulatory Visit: Payer: Self-pay

## 2015-05-11 DIAGNOSIS — Z947 Corneal transplant status: Secondary | ICD-10-CM | POA: Diagnosis not present

## 2015-05-11 DIAGNOSIS — I10 Essential (primary) hypertension: Secondary | ICD-10-CM | POA: Diagnosis not present

## 2015-05-11 DIAGNOSIS — E785 Hyperlipidemia, unspecified: Secondary | ICD-10-CM | POA: Diagnosis not present

## 2015-05-11 DIAGNOSIS — K210 Gastro-esophageal reflux disease with esophagitis: Secondary | ICD-10-CM | POA: Diagnosis not present

## 2015-05-11 DIAGNOSIS — Z6828 Body mass index (BMI) 28.0-28.9, adult: Secondary | ICD-10-CM | POA: Diagnosis not present

## 2015-05-11 DIAGNOSIS — R739 Hyperglycemia, unspecified: Secondary | ICD-10-CM | POA: Diagnosis not present

## 2015-05-11 DIAGNOSIS — D649 Anemia, unspecified: Secondary | ICD-10-CM | POA: Diagnosis not present

## 2015-05-11 DIAGNOSIS — Z1389 Encounter for screening for other disorder: Secondary | ICD-10-CM | POA: Diagnosis not present

## 2015-05-11 DIAGNOSIS — Z9181 History of falling: Secondary | ICD-10-CM | POA: Diagnosis not present

## 2015-05-11 DIAGNOSIS — I2510 Atherosclerotic heart disease of native coronary artery without angina pectoris: Secondary | ICD-10-CM | POA: Diagnosis not present

## 2015-06-08 DIAGNOSIS — H1811 Bullous keratopathy, right eye: Secondary | ICD-10-CM | POA: Diagnosis not present

## 2015-09-11 DIAGNOSIS — Z23 Encounter for immunization: Secondary | ICD-10-CM | POA: Diagnosis not present

## 2015-09-11 DIAGNOSIS — E785 Hyperlipidemia, unspecified: Secondary | ICD-10-CM | POA: Diagnosis not present

## 2015-09-11 DIAGNOSIS — H6120 Impacted cerumen, unspecified ear: Secondary | ICD-10-CM | POA: Diagnosis not present

## 2015-09-11 DIAGNOSIS — R739 Hyperglycemia, unspecified: Secondary | ICD-10-CM | POA: Diagnosis not present

## 2015-09-11 DIAGNOSIS — I10 Essential (primary) hypertension: Secondary | ICD-10-CM | POA: Diagnosis not present

## 2015-09-29 DIAGNOSIS — H1851 Endothelial corneal dystrophy: Secondary | ICD-10-CM | POA: Diagnosis not present

## 2016-01-19 ENCOUNTER — Other Ambulatory Visit (HOSPITAL_COMMUNITY): Payer: Medicare PPO

## 2016-01-26 ENCOUNTER — Ambulatory Visit: Payer: Medicare PPO | Admitting: Family

## 2016-02-04 ENCOUNTER — Encounter: Payer: Self-pay | Admitting: Family

## 2016-02-05 ENCOUNTER — Encounter: Payer: Self-pay | Admitting: Family

## 2016-02-05 ENCOUNTER — Ambulatory Visit (HOSPITAL_COMMUNITY)
Admission: RE | Admit: 2016-02-05 | Discharge: 2016-02-05 | Disposition: A | Discharge status: Home / Self Care | Payer: Medicare Other | Source: Ambulatory Visit | Attending: Vascular Surgery | Admitting: Vascular Surgery

## 2016-02-05 ENCOUNTER — Ambulatory Visit (INDEPENDENT_AMBULATORY_CARE_PROVIDER_SITE_OTHER): Payer: Medicare Other | Admitting: Family

## 2016-02-05 VITALS — BP 136/80 | HR 75 | Temp 96.8°F | Resp 18 | Ht 72.0 in | Wt 222.7 lb

## 2016-02-05 DIAGNOSIS — Z95828 Presence of other vascular implants and grafts: Secondary | ICD-10-CM

## 2016-02-05 DIAGNOSIS — I714 Abdominal aortic aneurysm, without rupture, unspecified: Secondary | ICD-10-CM

## 2016-02-05 DIAGNOSIS — Z87891 Personal history of nicotine dependence: Secondary | ICD-10-CM

## 2016-02-05 DIAGNOSIS — Z4889 Encounter for other specified surgical aftercare: Secondary | ICD-10-CM

## 2016-02-05 DIAGNOSIS — I10 Essential (primary) hypertension: Secondary | ICD-10-CM | POA: Insufficient documentation

## 2016-02-05 DIAGNOSIS — Z48812 Encounter for surgical aftercare following surgery on the circulatory system: Secondary | ICD-10-CM

## 2016-02-05 DIAGNOSIS — K219 Gastro-esophageal reflux disease without esophagitis: Secondary | ICD-10-CM | POA: Diagnosis not present

## 2016-02-05 DIAGNOSIS — E785 Hyperlipidemia, unspecified: Secondary | ICD-10-CM | POA: Diagnosis not present

## 2016-02-05 NOTE — Addendum Note (Signed)
Addended by: Mena Goes on: 02/05/2016 02:20 PM   Modules accepted: Orders

## 2016-02-05 NOTE — Progress Notes (Signed)
[facility] [place]   CC: Follow up s/p EVAR  History of Present Illness  Johnny Leblanc is a 82 y.o. (02/18/1928) male patient of Dr. Donnetta Hutching who presents for routine follow up s/p extension of both iliac limbs due to large endoleak with expanding AAA in 2013. His prior stent graft repair was performed in [place] in 2001. Most recent CTA (Date: 12/24/13) demonstrates: type II endoleak and stable sac size. The patient has not had back or abdominal pain.   Pt Diabetic: No Pt smoker: former smoker, quit in 1982   Past Medical History  Diagnosis Date  . Stented coronary artery   . Hypertension     takes Maxzide,Procardia,and Metoprolol daily  . Hyperlipidemia     takes Lopid daily  . Coronary artery disease   . Arthritis   . Bruises easily     pt takes Plavix and office told them to continue his plavix and asa  . GERD (gastroesophageal reflux disease)     takes Omeprazole daily  . H/O hiatal hernia   . Constipation     takes Metamucil daily  . Diverticulosis   . Urinary frequency   . Nocturia    Past Surgical History  Procedure Laterality Date  . Coronary angioplasty  90's/2003/2013    4  . Cataract surgery      bilateral  . Colonoscopy    . Abdominal aortagram N/A 03/12/2012    Procedure: ABDOMINAL Maxcine Ham;  Surgeon: Conrad Spencer, MD;  Location: [facility] CATH LAB;  Service: Cardiovascular;  Laterality: N/A;   Social History Social History  Substance Use Topics  . Smoking status: Former Smoker    Types: Cigarettes    Quit date: 05/22/1981  . Smokeless tobacco: Never Used     Comment: quit 30+yrs ago  . Alcohol Use: No   Family History Family History  Problem Relation Age of Onset  . Aneurysm Father   . Heart attack Father   . Aneurysm Brother   . Diabetes Brother   . Cancer Mother    Current Outpatient Prescriptions on File Prior to Visit  Medication Sig Dispense Refill  . aspirin 325 MG EC tablet Take 325 mg by mouth daily.    .  clopidogrel (PLAVIX) 75 MG tablet Take 75 mg by mouth daily.    . colesevelam (WELCHOL) 625 MG tablet Take 2,500 mg by mouth 2 (two) times daily with a meal.    . Fe Fum-FA-B Cmp-C-Zn-Mg-Mn-Cu (CENTRATEX) 106-1 MG CAPS Take 1 tablet by mouth daily.    Marland Kitchen gemfibrozil (LOPID) 600 MG tablet Take 600 mg by mouth 2 (two) times daily before a meal.    . metoprolol (TOPROL-XL) 200 MG 24 hr tablet Take 200 mg by mouth daily.    Marland Kitchen NIFEdipine (PROCARDIA XL/ADALAT-CC) 60 MG 24 hr tablet Take 60 mg by mouth daily.    . nitroGLYCERIN (NITROSTAT) 0.4 MG SL tablet Place 0.4 mg under the tongue every 5 (five) minutes as needed. As needed for chest pain.    Marland Kitchen omeprazole (PRILOSEC) 20 MG capsule Take 20 mg by mouth daily.    . psyllium (HYDROCIL/METAMUCIL) 95 % PACK Take 1 packet by mouth daily.    Marland Kitchen triamterene-hydrochlorothiazide (MAXZIDE-25) 37.5-25 MG per tablet Take 1 tablet by mouth daily.     No current facility-administered medications on file prior to visit.   No Known Allergies   ROS: See HPI for pertinent positives and negatives.  Physical Examination  Filed Vitals:  02/05/16 0910  BP: 136/80  Pulse: 75  Temp: 96.8 F (36 C)  TempSrc: Oral  Resp: 18  Height: 6' (1.829 m)  Weight: 222 lb 11.2 oz (101.016 kg)  SpO2: 99%   Body mass index is 30.2 kg/(m^2).  General: A&O x 3, WD, obese male  Pulmonary: Sym exp, good air movt, CTAB, no rales, rhonchi, or wheezing   Cardiac: RRR, Nl S1, S2, no murmur appreciated  Vascular: Vessel Right Left  Radial 2+Palpable 2+Palpable  Carotid  without bruit  without bruit  Aorta Not palpable N/A  Femoral 3+Palpable 2+Palpable  Popliteal Not palpable Not palpable  PT 2+Palpable 1+Palpable  DP 2+Palpable 2+Palpable   Gastrointestinal: soft, NTND, -G/R, - HSM, - palpable masses, - CVAT B.  Musculoskeletal: M/S 5/5 throughout, extremities without ischemic changes.  Neurologic: Pain and light touch intact in extremities, Motor exam as listed  above. CN 2-12 grossly intact except is hard of hearting.    Non-Invasive Vascular Imaging  EVAR Duplex (Date: 02/05/2016)  AAA sac size: 8.6 cm x 8.9 cm; left common iliac 2.6 cm   Previous (01/13/15 CT): 9.1 cm x 8.1 cm, left common iliac 2.6 cm  Unable to rule out endo-leak due to large sac with heterogenous composition.  Stable left CIA aneurysm  No significant change compared to CT a year ago     Medical Decision Making  Johnny Leblanc is a 82 y.o. male who is s/p extension of both iliac limbs due to large endoleak with expanding AAA in 2013. His prior stent graft repair was performed in [place] in 2001. Most recent CTA (Date: 12/24/13) demonstrates: type II endoleak and stable sac size. The patient has not had back or abdominal pain.  EVAR duplex today suggests stable left CIA aneurysm with no significant change compared to CT a year ago  Unable to rule out endo-leak due to large sac with heterogenous composition.  See above results for measurements  I discussed with Dr. Oneida Alar pt EVAR results.    I discussed with the patient the importance of surveillance of the endograft.  The next CTA will be scheduled for 12 months.  The patient will follow up with me in 12 months with these studies on a day that Dr. Donnetta Hutching is in the office, not in vein clinic.  I emphasized the importance of maximal medical management including strict control of blood pressure, blood glucose, and lipid levels, antiplatelet agents, obtaining regular exercise, and cessation of smoking.   Thank you for allowing [place] to participate in this patient's care.  Clemon Chambers, RN, MSN, FNP-C [facility] [place] Office: [facility] Physician: Oneida Alar  02/05/2016, 9:21 AM

## 2017-01-25 ENCOUNTER — Encounter: Payer: Self-pay | Admitting: Family

## 2017-01-31 ENCOUNTER — Other Ambulatory Visit: Payer: Medicare Other

## 2017-02-07 ENCOUNTER — Ambulatory Visit: Payer: Medicare Other | Admitting: Family

## 2019-12-24 DIAGNOSIS — I2510 Atherosclerotic heart disease of native coronary artery without angina pectoris: Secondary | ICD-10-CM | POA: Diagnosis not present

## 2019-12-24 DIAGNOSIS — I10 Essential (primary) hypertension: Secondary | ICD-10-CM | POA: Diagnosis not present

## 2019-12-24 DIAGNOSIS — E785 Hyperlipidemia, unspecified: Secondary | ICD-10-CM | POA: Diagnosis not present

## 2019-12-24 DIAGNOSIS — Z139 Encounter for screening, unspecified: Secondary | ICD-10-CM | POA: Diagnosis not present

## 2019-12-24 DIAGNOSIS — D649 Anemia, unspecified: Secondary | ICD-10-CM | POA: Diagnosis not present

## 2019-12-24 DIAGNOSIS — E559 Vitamin D deficiency, unspecified: Secondary | ICD-10-CM | POA: Diagnosis not present

## 2019-12-24 DIAGNOSIS — N400 Enlarged prostate without lower urinary tract symptoms: Secondary | ICD-10-CM | POA: Diagnosis not present

## 2019-12-24 DIAGNOSIS — I714 Abdominal aortic aneurysm, without rupture: Secondary | ICD-10-CM | POA: Diagnosis not present

## 2019-12-24 DIAGNOSIS — L309 Dermatitis, unspecified: Secondary | ICD-10-CM | POA: Diagnosis not present

## 2020-03-23 DIAGNOSIS — D649 Anemia, unspecified: Secondary | ICD-10-CM | POA: Diagnosis not present

## 2020-03-23 DIAGNOSIS — N400 Enlarged prostate without lower urinary tract symptoms: Secondary | ICD-10-CM | POA: Diagnosis not present

## 2020-03-23 DIAGNOSIS — E559 Vitamin D deficiency, unspecified: Secondary | ICD-10-CM | POA: Diagnosis not present

## 2020-03-23 DIAGNOSIS — I714 Abdominal aortic aneurysm, without rupture: Secondary | ICD-10-CM | POA: Diagnosis not present

## 2020-03-23 DIAGNOSIS — I10 Essential (primary) hypertension: Secondary | ICD-10-CM | POA: Diagnosis not present

## 2020-03-23 DIAGNOSIS — K2100 Gastro-esophageal reflux disease with esophagitis, without bleeding: Secondary | ICD-10-CM | POA: Diagnosis not present

## 2020-03-23 DIAGNOSIS — I2510 Atherosclerotic heart disease of native coronary artery without angina pectoris: Secondary | ICD-10-CM | POA: Diagnosis not present

## 2020-03-23 DIAGNOSIS — E538 Deficiency of other specified B group vitamins: Secondary | ICD-10-CM | POA: Diagnosis not present

## 2020-05-12 DIAGNOSIS — Z947 Corneal transplant status: Secondary | ICD-10-CM | POA: Diagnosis not present

## 2020-05-12 DIAGNOSIS — H40051 Ocular hypertension, right eye: Secondary | ICD-10-CM | POA: Diagnosis not present

## 2020-05-12 DIAGNOSIS — H35341 Macular cyst, hole, or pseudohole, right eye: Secondary | ICD-10-CM | POA: Diagnosis not present

## 2020-05-12 DIAGNOSIS — Z961 Presence of intraocular lens: Secondary | ICD-10-CM | POA: Diagnosis not present

## 2020-05-12 DIAGNOSIS — H524 Presbyopia: Secondary | ICD-10-CM | POA: Diagnosis not present

## 2020-06-23 DIAGNOSIS — D649 Anemia, unspecified: Secondary | ICD-10-CM | POA: Diagnosis not present

## 2020-06-23 DIAGNOSIS — I2510 Atherosclerotic heart disease of native coronary artery without angina pectoris: Secondary | ICD-10-CM | POA: Diagnosis not present

## 2020-06-23 DIAGNOSIS — N400 Enlarged prostate without lower urinary tract symptoms: Secondary | ICD-10-CM | POA: Diagnosis not present

## 2020-06-23 DIAGNOSIS — Z79899 Other long term (current) drug therapy: Secondary | ICD-10-CM | POA: Diagnosis not present

## 2020-06-23 DIAGNOSIS — E538 Deficiency of other specified B group vitamins: Secondary | ICD-10-CM | POA: Diagnosis not present

## 2020-06-23 DIAGNOSIS — I10 Essential (primary) hypertension: Secondary | ICD-10-CM | POA: Diagnosis not present

## 2020-06-23 DIAGNOSIS — E785 Hyperlipidemia, unspecified: Secondary | ICD-10-CM | POA: Diagnosis not present

## 2020-06-23 DIAGNOSIS — Z6829 Body mass index (BMI) 29.0-29.9, adult: Secondary | ICD-10-CM | POA: Diagnosis not present

## 2020-06-23 DIAGNOSIS — E559 Vitamin D deficiency, unspecified: Secondary | ICD-10-CM | POA: Diagnosis not present

## 2020-06-23 DIAGNOSIS — I714 Abdominal aortic aneurysm, without rupture: Secondary | ICD-10-CM | POA: Diagnosis not present

## 2020-07-08 DIAGNOSIS — H40002 Preglaucoma, unspecified, left eye: Secondary | ICD-10-CM | POA: Diagnosis not present

## 2020-09-23 DIAGNOSIS — Z23 Encounter for immunization: Secondary | ICD-10-CM | POA: Diagnosis not present

## 2020-09-23 DIAGNOSIS — I714 Abdominal aortic aneurysm, without rupture: Secondary | ICD-10-CM | POA: Diagnosis not present

## 2020-09-23 DIAGNOSIS — E785 Hyperlipidemia, unspecified: Secondary | ICD-10-CM | POA: Diagnosis not present

## 2020-09-23 DIAGNOSIS — D649 Anemia, unspecified: Secondary | ICD-10-CM | POA: Diagnosis not present

## 2020-09-23 DIAGNOSIS — Z6828 Body mass index (BMI) 28.0-28.9, adult: Secondary | ICD-10-CM | POA: Diagnosis not present

## 2020-09-23 DIAGNOSIS — N400 Enlarged prostate without lower urinary tract symptoms: Secondary | ICD-10-CM | POA: Diagnosis not present

## 2020-09-23 DIAGNOSIS — E538 Deficiency of other specified B group vitamins: Secondary | ICD-10-CM | POA: Diagnosis not present

## 2020-09-23 DIAGNOSIS — I2510 Atherosclerotic heart disease of native coronary artery without angina pectoris: Secondary | ICD-10-CM | POA: Diagnosis not present

## 2020-09-23 DIAGNOSIS — E559 Vitamin D deficiency, unspecified: Secondary | ICD-10-CM | POA: Diagnosis not present

## 2020-11-04 DIAGNOSIS — H40002 Preglaucoma, unspecified, left eye: Secondary | ICD-10-CM | POA: Diagnosis not present

## 2020-11-04 DIAGNOSIS — H40051 Ocular hypertension, right eye: Secondary | ICD-10-CM | POA: Diagnosis not present

## 2020-12-24 DIAGNOSIS — E785 Hyperlipidemia, unspecified: Secondary | ICD-10-CM | POA: Diagnosis not present

## 2020-12-24 DIAGNOSIS — E538 Deficiency of other specified B group vitamins: Secondary | ICD-10-CM | POA: Diagnosis not present

## 2020-12-24 DIAGNOSIS — Z139 Encounter for screening, unspecified: Secondary | ICD-10-CM | POA: Diagnosis not present

## 2020-12-24 DIAGNOSIS — Z6828 Body mass index (BMI) 28.0-28.9, adult: Secondary | ICD-10-CM | POA: Diagnosis not present

## 2020-12-24 DIAGNOSIS — E559 Vitamin D deficiency, unspecified: Secondary | ICD-10-CM | POA: Diagnosis not present

## 2020-12-24 DIAGNOSIS — Z79899 Other long term (current) drug therapy: Secondary | ICD-10-CM | POA: Diagnosis not present

## 2020-12-24 DIAGNOSIS — I10 Essential (primary) hypertension: Secondary | ICD-10-CM | POA: Diagnosis not present

## 2020-12-24 DIAGNOSIS — Z1331 Encounter for screening for depression: Secondary | ICD-10-CM | POA: Diagnosis not present

## 2020-12-24 DIAGNOSIS — D649 Anemia, unspecified: Secondary | ICD-10-CM | POA: Diagnosis not present

## 2020-12-24 DIAGNOSIS — Z9181 History of falling: Secondary | ICD-10-CM | POA: Diagnosis not present

## 2021-03-31 DIAGNOSIS — D649 Anemia, unspecified: Secondary | ICD-10-CM | POA: Diagnosis not present

## 2021-03-31 DIAGNOSIS — I2510 Atherosclerotic heart disease of native coronary artery without angina pectoris: Secondary | ICD-10-CM | POA: Diagnosis not present

## 2021-03-31 DIAGNOSIS — Z6827 Body mass index (BMI) 27.0-27.9, adult: Secondary | ICD-10-CM | POA: Diagnosis not present

## 2021-03-31 DIAGNOSIS — E538 Deficiency of other specified B group vitamins: Secondary | ICD-10-CM | POA: Diagnosis not present

## 2021-03-31 DIAGNOSIS — E785 Hyperlipidemia, unspecified: Secondary | ICD-10-CM | POA: Diagnosis not present

## 2021-03-31 DIAGNOSIS — I714 Abdominal aortic aneurysm, without rupture: Secondary | ICD-10-CM | POA: Diagnosis not present

## 2021-03-31 DIAGNOSIS — N400 Enlarged prostate without lower urinary tract symptoms: Secondary | ICD-10-CM | POA: Diagnosis not present

## 2021-03-31 DIAGNOSIS — I10 Essential (primary) hypertension: Secondary | ICD-10-CM | POA: Diagnosis not present

## 2021-03-31 DIAGNOSIS — E559 Vitamin D deficiency, unspecified: Secondary | ICD-10-CM | POA: Diagnosis not present

## 2021-04-16 DIAGNOSIS — I10 Essential (primary) hypertension: Secondary | ICD-10-CM | POA: Diagnosis not present

## 2021-04-16 DIAGNOSIS — Z6827 Body mass index (BMI) 27.0-27.9, adult: Secondary | ICD-10-CM | POA: Diagnosis not present

## 2021-05-10 DIAGNOSIS — H40002 Preglaucoma, unspecified, left eye: Secondary | ICD-10-CM | POA: Diagnosis not present

## 2021-05-10 DIAGNOSIS — H40051 Ocular hypertension, right eye: Secondary | ICD-10-CM | POA: Diagnosis not present

## 2021-11-29 DIAGNOSIS — N400 Enlarged prostate without lower urinary tract symptoms: Secondary | ICD-10-CM | POA: Diagnosis not present

## 2021-11-29 DIAGNOSIS — I10 Essential (primary) hypertension: Secondary | ICD-10-CM | POA: Diagnosis not present

## 2021-11-29 DIAGNOSIS — D649 Anemia, unspecified: Secondary | ICD-10-CM | POA: Diagnosis not present

## 2021-11-29 DIAGNOSIS — Z125 Encounter for screening for malignant neoplasm of prostate: Secondary | ICD-10-CM | POA: Diagnosis not present

## 2021-11-29 DIAGNOSIS — E559 Vitamin D deficiency, unspecified: Secondary | ICD-10-CM | POA: Diagnosis not present

## 2021-11-29 DIAGNOSIS — Z6826 Body mass index (BMI) 26.0-26.9, adult: Secondary | ICD-10-CM | POA: Diagnosis not present

## 2021-11-29 DIAGNOSIS — E785 Hyperlipidemia, unspecified: Secondary | ICD-10-CM | POA: Diagnosis not present

## 2021-11-29 DIAGNOSIS — K2100 Gastro-esophageal reflux disease with esophagitis, without bleeding: Secondary | ICD-10-CM | POA: Diagnosis not present

## 2022-01-06 DIAGNOSIS — H40012 Open angle with borderline findings, low risk, left eye: Secondary | ICD-10-CM | POA: Diagnosis not present

## 2022-03-03 DIAGNOSIS — D649 Anemia, unspecified: Secondary | ICD-10-CM | POA: Diagnosis not present

## 2022-03-03 DIAGNOSIS — Z139 Encounter for screening, unspecified: Secondary | ICD-10-CM | POA: Diagnosis not present

## 2022-03-03 DIAGNOSIS — Z1331 Encounter for screening for depression: Secondary | ICD-10-CM | POA: Diagnosis not present

## 2022-03-03 DIAGNOSIS — E785 Hyperlipidemia, unspecified: Secondary | ICD-10-CM | POA: Diagnosis not present

## 2022-03-03 DIAGNOSIS — Z9181 History of falling: Secondary | ICD-10-CM | POA: Diagnosis not present

## 2022-03-03 DIAGNOSIS — I10 Essential (primary) hypertension: Secondary | ICD-10-CM | POA: Diagnosis not present

## 2022-03-03 DIAGNOSIS — E538 Deficiency of other specified B group vitamins: Secondary | ICD-10-CM | POA: Diagnosis not present

## 2022-03-03 DIAGNOSIS — N400 Enlarged prostate without lower urinary tract symptoms: Secondary | ICD-10-CM | POA: Diagnosis not present

## 2022-03-03 DIAGNOSIS — H6121 Impacted cerumen, right ear: Secondary | ICD-10-CM | POA: Diagnosis not present

## 2022-03-17 DIAGNOSIS — D649 Anemia, unspecified: Secondary | ICD-10-CM | POA: Diagnosis not present

## 2022-04-18 DIAGNOSIS — D649 Anemia, unspecified: Secondary | ICD-10-CM | POA: Diagnosis not present

## 2022-06-13 DIAGNOSIS — D509 Iron deficiency anemia, unspecified: Secondary | ICD-10-CM | POA: Diagnosis not present

## 2022-06-13 DIAGNOSIS — E785 Hyperlipidemia, unspecified: Secondary | ICD-10-CM | POA: Diagnosis not present

## 2022-06-13 DIAGNOSIS — K2100 Gastro-esophageal reflux disease with esophagitis, without bleeding: Secondary | ICD-10-CM | POA: Diagnosis not present

## 2022-06-13 DIAGNOSIS — I714 Abdominal aortic aneurysm, without rupture, unspecified: Secondary | ICD-10-CM | POA: Diagnosis not present

## 2022-06-13 DIAGNOSIS — E559 Vitamin D deficiency, unspecified: Secondary | ICD-10-CM | POA: Diagnosis not present

## 2022-06-13 DIAGNOSIS — E538 Deficiency of other specified B group vitamins: Secondary | ICD-10-CM | POA: Diagnosis not present

## 2022-06-13 DIAGNOSIS — I10 Essential (primary) hypertension: Secondary | ICD-10-CM | POA: Diagnosis not present

## 2022-06-13 DIAGNOSIS — N400 Enlarged prostate without lower urinary tract symptoms: Secondary | ICD-10-CM | POA: Diagnosis not present

## 2022-06-13 DIAGNOSIS — I2510 Atherosclerotic heart disease of native coronary artery without angina pectoris: Secondary | ICD-10-CM | POA: Diagnosis not present

## 2022-06-13 DIAGNOSIS — D649 Anemia, unspecified: Secondary | ICD-10-CM | POA: Diagnosis not present

## 2022-06-28 DIAGNOSIS — D649 Anemia, unspecified: Secondary | ICD-10-CM | POA: Diagnosis not present

## 2022-08-02 DIAGNOSIS — D649 Anemia, unspecified: Secondary | ICD-10-CM | POA: Diagnosis not present

## 2022-08-05 NOTE — Progress Notes (Signed)
[facility] Initial Visit:  Patient Care Team: Ocie Doyne., MD as PCP - General (Unknown Physician Specialty)  CHIEF COMPLAINTS/PURPOSE OF CONSULTATION:  Oncology History   No history exists.    HISTORY OF PRESENTING ILLNESS: Johnny Leblanc 88 y.o. male is here because of  anemia Medical history notable for arthritis, easy bruising (on Plavix and aspirin) Metamucil, coronary artery disease, diverticulosis, GERD, hyperlipidemia, hypertension, abdominal aortic aneurysm B12 deficiency, BPH  June 13, 2022 vitamin B12 1492 June 28, 2022 WBC 7.5 hemoglobin 10.5 MCV 99 platelet count 328; 73 segs 15 lymphs 7 monos 5 eos  August 03, 2022:  WBC 11.0 hemoglobin 10.2 MCV 95 platelet 594; 76 segs 12 lymphs 7 monos 4 eos 1 basophil Ferritin 86  Chemistry is notable for albumin 3.8 creatinine 1.00  August 08 2022:  [facility] Hematology Consult Patient has been noted to have anemia for about 15 yrs per family.   He has taken iron supplements in the past and these have recently been restarted about a week ago.  Has tolerated them in the past.  No history of blood transfusion or IV iron.  Last colonoscopy was about 20 yrs ago.  Does not recall having undergone and EGD.   Ambulates with walker at home.  Here today in a rollator.  Lives with daughter and can do basic ADL's  Social:  widowed.  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  Began smoking in high school and quit about 50 yrs ago.  Can't recall how much he smoked.  EtOH none  FMH:   Mother died 105 skin cancer Father died 54 CAD Brother died 55 aneurism    Review of Systems  Constitutional:  Negative for chills and fever.       Appetite decreased and has lost weight  HENT:   Positive for hearing loss. Negative for mouth sores and trouble swallowing.   Respiratory:  Positive for cough. Negative for hemoptysis and shortness of breath.        Slight cough productive of clear mucus.     Cardiovascular:  Positive for leg swelling. Negative for chest pain and palpitations.  Gastrointestinal:  Positive for diarrhea. Negative for abdominal pain, blood in stool, constipation, nausea and vomiting.       Stools dark and greenish on oral iron.  Occasional diarrhea  Genitourinary:  Negative for dysuria, frequency and hematuria.   Musculoskeletal:  Negative for arthralgias, back pain and myalgias.  Neurological:  Positive for extremity weakness. Negative for seizures.       No falls.  Uses walker at home  Hematological:  Negative for adenopathy.       Has easy bruising but no bleeding    MEDICAL HISTORY: Past Medical History:  Diagnosis Date   Arthritis    Bruises easily    pt takes Plavix and office told them to continue his plavix and asa   Constipation    takes Metamucil daily   Coronary artery disease    Diverticulosis    GERD (gastroesophageal reflux disease)    takes Omeprazole daily   H/O hiatal hernia    Hyperlipidemia    takes Lopid daily   Hypertension    takes Maxzide,Procardia,and Metoprolol daily   Nocturia    Stented coronary artery    Urinary frequency     SURGICAL HISTORY: Past Surgical History:  Procedure Laterality Date   ABDOMINAL AORTAGRAM N/A 03/12/2012   Procedure: ABDOMINAL AORTAGRAM;  Surgeon:  Conrad [place], MD;  Location: [facility] CATH LAB;  Service: Cardiovascular;  Laterality: N/A;   cataract surgery     bilateral   COLONOSCOPY     CORONARY ANGIOPLASTY  90's/2003/2013   4    SOCIAL HISTORY: Social History   Socioeconomic History   Marital status: Widowed    Spouse name: Not on file   Number of children: 2   Years of education: 50   Highest education level: High school graduate  Occupational History   Not on file  Tobacco Use   Smoking status: Former    Types: Cigarettes    Quit date: 05/22/1981    Years since quitting: 41.2   Smokeless tobacco: Never   Tobacco comments:    quit 30+yrs ago  Vaping Use   Vaping Use: Never used   Substance and Sexual Activity   Alcohol use: No   Drug use: No   Sexual activity: Never  Other Topics Concern   Not on file  Social History Narrative   Not on file   Social Determinants of Health   Financial Resource Strain: Not on file  Food Insecurity: Not on file  Transportation Needs: Not on file  Physical Activity: Not on file  Stress: Not on file  Social Connections: Not on file  Intimate Partner Violence: Not on file    FAMILY HISTORY Family History  Problem Relation Age of Onset   Aneurysm Father    Heart attack Father    Aneurysm Brother    Diabetes Brother    Cancer Mother     ALLERGIES:  is allergic to protonix [pantoprazole].  MEDICATIONS:  Current Outpatient Medications  Medication Sig Dispense Refill   aspirin 325 MG EC tablet Take 325 mg by mouth daily.     brimonidine (ALPHAGAN) 0.2 % ophthalmic solution 2 (two) times daily.     calcium-vitamin D (OSCAL WITH D) 500-5 MG-MCG tablet Take 1 tablet by mouth.     clopidogrel (PLAVIX) 75 MG tablet Take 75 mg by mouth daily.     colesevelam (WELCHOL) 625 MG tablet Take 2,500 mg by mouth 2 (two) times daily with a meal.     ezetimibe (ZETIA) 10 MG tablet Take 10 mg by mouth daily.     Fe Fum-FA-B Cmp-C-Zn-Mg-Mn-Cu (CENTRATEX) 106-1 MG CAPS Take 1 tablet by mouth daily.     ferrous sulfate 324 MG TBEC Take 324 mg by mouth daily with breakfast.     gemfibrozil (LOPID) 600 MG tablet Take 600 mg by mouth 2 (two) times daily before a meal.     Methylcobalamin (B12-ACTIVE) 1 MG CHEW Chew by mouth.     metoprolol (TOPROL-XL) 200 MG 24 hr tablet Take 200 mg by mouth daily.     NIFEdipine (PROCARDIA XL/ADALAT-CC) 60 MG 24 hr tablet Take 60 mg by mouth daily.     nitroGLYCERIN (NITROSTAT) 0.4 MG SL tablet Place 0.4 mg under the tongue every 5 (five) minutes as needed. As needed for chest pain.     omeprazole (PRILOSEC) 20 MG capsule Take 20 mg by mouth daily.     prednisoLONE acetate (PRED FORTE) 1 % ophthalmic  suspension daily.     psyllium (HYDROCIL/METAMUCIL) 95 % PACK Take 1 packet by mouth daily.     tamsulosin (FLOMAX) 0.4 MG CAPS capsule Take 0.4 mg by mouth at bedtime.     triamterene-hydrochlorothiazide (MAXZIDE-25) 37.5-25 MG per tablet Take 1 tablet by mouth daily.     Vitamin D, Ergocalciferol, (DRISDOL) 1.25 MG (  50000 UNIT) CAPS capsule Take 50,000 Units by mouth every 7 (seven) days.     triamcinolone cream (KENALOG) 0.1 % Apply 1 Application topically 2 (two) times daily. (Patient not taking: Reported on 08/08/2022)     No current facility-administered medications for this visit.    PHYSICAL EXAMINATION:  ECOG PERFORMANCE STATUS: 3 - Symptomatic, >50% confined to bed   Vitals:   08/08/22 0835  BP: 127/60  Pulse: 73  Resp: 14  Temp: 98.2 F (36.8 C)  SpO2: 97%    Filed Weights   08/08/22 0835  Weight: 206 lb (93.4 kg)     Physical Exam Vitals and nursing note reviewed.  Constitutional:      General: He is not in acute distress.    Appearance: Normal appearance. He is not toxic-appearing or diaphoretic.     Comments: Here with daughter and nephew.  Seated in rollator  HENT:     Head: Normocephalic and atraumatic.     Right Ear: External ear normal.     Left Ear: External ear normal.     Nose: Nose normal.  Eyes:     General: No scleral icterus.    Conjunctiva/sclera: Conjunctivae normal.     Pupils: Pupils are equal, round, and reactive to light.  Cardiovascular:     Rate and Rhythm: Normal rate and regular rhythm.     Heart sounds: Normal heart sounds. No murmur heard.    No friction rub. No gallop.  Pulmonary:     Effort: Pulmonary effort is normal. No respiratory distress.     Breath sounds: Normal breath sounds. No stridor. No wheezing, rhonchi or rales.  Chest:     Chest wall: No tenderness.  Abdominal:     General: Bowel sounds are normal. There is no distension.     Palpations: Abdomen is soft. There is no mass.     Tenderness: There is no  abdominal tenderness. There is no guarding or rebound.  Musculoskeletal:        General: No signs of injury.     Cervical back: Normal range of motion and neck supple. No rigidity or tenderness.     Right lower leg: No edema.     Left lower leg: No edema.     Comments: Examined seated in chair  Lymphadenopathy:     Head:     Right side of head: No submental, submandibular, tonsillar, preauricular, posterior auricular or occipital adenopathy.     Left side of head: No submental, submandibular, tonsillar, preauricular, posterior auricular or occipital adenopathy.     Cervical: No cervical adenopathy.     Right cervical: No superficial, deep or posterior cervical adenopathy.    Left cervical: No superficial, deep or posterior cervical adenopathy.     Upper Body:     Right upper body: No supraclavicular or axillary adenopathy.     Left upper body: No supraclavicular or axillary adenopathy.     Lower Body: No right inguinal adenopathy. No left inguinal adenopathy.  Skin:    Coloration: Skin is not jaundiced.  Neurological:     General: No focal deficit present.     Mental Status: He is alert and oriented to person, place, and time. Mental status is at baseline.     Comments: Hard of hearing.  Gait not assessed as patient is in a rollator  Psychiatric:        Mood and Affect: Mood normal.        Behavior: Behavior normal.  Thought Content: Thought content normal.        Judgment: Judgment normal.      LABORATORY DATA: I have personally reviewed the data as listed:  No visits with results within 1 Month(s) from this visit.  Latest known visit with results is:  Hospital Outpatient Visit on 12/24/2013  Component Date Value Ref Range Status   BUN 12/23/2013 24 (H)  6 - 23 mg/dL Final   Creat 12/23/2013 1.02  0.50 - 1.35 mg/dL Final    RADIOGRAPHIC STUDIES: I have personally reviewed the radiological images as listed and agree with the findings in the report  No results  found.  ASSESSMENT/PLAN  88 y.o. male with medical history notable for arthritis, easy bruising (on Plavix and aspirin)  coronary artery disease, diverticulosis, GERD, hyperlipidemia, hypertension, abdominal aortic aneurysm B12 deficiency, BPH who has a long history of anemia  Anemia:  Likely multifactorial with main suspects being iron deficiency from chronic GI blood loss exacerbated by antiplatelet therapy, CKD.  Other possibilities are low testosterone level, senescence, hemolysis due to vascular disease.  Obtain CBC with diff, CMP, retic count, Ferritin B12, folate, DAT, Haptoglobin, SPEP with IEP, free light chains, testosterone, PSA.    Therapeutics:  Since patient is frail elderly with Hgb around 10  and will tolerate anemia poorly will arrange for IV iron replacement.   Given the severe symptoms we prefer to replete iron stores in one or two visits rather than over the course of several months.  In addition ongoing blood loss exceeds the capacity of oral iron to meet needs. A discussion regarding risks was had with the patient.  IV iron has the potential to cause allergic reactions, including potentially life-threatening anaphylaxis.   IV iron may be associated with non-allergic infusion reactions including self-limiting urticaria, palpitations, dizziness, and neck and back spasm; generally, these occur in <1 percent of individuals and do not progress to more serious reactions. The non-allergic reaction consisting of flushing of the face and myalgias of the chest and back.   After discussion of the risks and benefits of IV iron therapy patient has elected to proceed with parenteral iron therapy.    Decreased mobility:  Secondary to frail elderly status which may be exacerbated by anemia  Vascular disease:  On ASA and Plavix with likely needs to be continued indefinitely      Cancer Staging  No matching staging information was found for the patient.   No problem-specific Assessment & Plan  notes found for this encounter.   Orders Placed This Encounter  Procedures   CBC with Differential ([facility] Only)    Standing Status:   Future    Number of Occurrences:   1    Standing Expiration Date:   08/09/2023   CMP ([facility] only)    Standing Status:   Future    Number of Occurrences:   1    Standing Expiration Date:   08/09/2023   Ferritin    Standing Status:   Future    Number of Occurrences:   1    Standing Expiration Date:   08/09/2023   Folate    Standing Status:   Future    Number of Occurrences:   1    Standing Expiration Date:   08/09/2023   Vitamin B12    Standing Status:   Future    Number of Occurrences:   1    Standing Expiration Date:   08/09/2023   Reticulocytes    Standing  Status:   Future    Number of Occurrences:   1    Standing Expiration Date:   08/09/2023   Multiple Myeloma Panel (SPEP&IFE w/QIG)    Standing Status:   Future    Number of Occurrences:   1    Standing Expiration Date:   08/09/2023   Kappa/lambda light chains    Standing Status:   Future    Number of Occurrences:   1    Standing Expiration Date:   08/09/2023   Haptoglobin    Standing Status:   Future    Number of Occurrences:   1    Standing Expiration Date:   08/09/2023   Testosterone   PSA    Standing Status:   Future    Standing Expiration Date:   08/09/2023   Direct antiglobulin test (not at [facility])    Standing Status:   Future    Number of Occurrences:   1    Standing Expiration Date:   08/09/2023    All questions were answered. The patient knows to call the clinic with any problems, questions or concerns.  This note was electronically signed.    Barbee Cough, MD  08/08/2022 9:34 AM

## 2022-08-08 ENCOUNTER — Inpatient Hospital Stay: Payer: Medicare PPO | Attending: Oncology | Admitting: Oncology

## 2022-08-08 ENCOUNTER — Inpatient Hospital Stay: Payer: Medicare PPO

## 2022-08-08 ENCOUNTER — Encounter: Payer: Self-pay | Admitting: Oncology

## 2022-08-08 DIAGNOSIS — I129 Hypertensive chronic kidney disease with stage 1 through stage 4 chronic kidney disease, or unspecified chronic kidney disease: Secondary | ICD-10-CM | POA: Insufficient documentation

## 2022-08-08 DIAGNOSIS — N189 Chronic kidney disease, unspecified: Secondary | ICD-10-CM | POA: Insufficient documentation

## 2022-08-08 DIAGNOSIS — Z7409 Other reduced mobility: Secondary | ICD-10-CM | POA: Insufficient documentation

## 2022-08-08 DIAGNOSIS — N183 Chronic kidney disease, stage 3 unspecified: Secondary | ICD-10-CM | POA: Diagnosis not present

## 2022-08-08 DIAGNOSIS — D649 Anemia, unspecified: Secondary | ICD-10-CM | POA: Diagnosis not present

## 2022-08-08 DIAGNOSIS — Z7902 Long term (current) use of antithrombotics/antiplatelets: Secondary | ICD-10-CM

## 2022-08-08 DIAGNOSIS — Z808 Family history of malignant neoplasm of other organs or systems: Secondary | ICD-10-CM | POA: Diagnosis not present

## 2022-08-08 DIAGNOSIS — Z87891 Personal history of nicotine dependence: Secondary | ICD-10-CM | POA: Diagnosis not present

## 2022-08-08 DIAGNOSIS — D539 Nutritional anemia, unspecified: Secondary | ICD-10-CM

## 2022-08-08 DIAGNOSIS — N289 Disorder of kidney and ureter, unspecified: Secondary | ICD-10-CM | POA: Insufficient documentation

## 2022-08-08 LAB — CBC WITH DIFFERENTIAL (CANCER CENTER ONLY)
Abs Immature Granulocytes: 0.03 10*3/uL (ref 0.00–0.07)
Basophils Absolute: 0 10*3/uL (ref 0.0–0.1)
Basophils Relative: 0 %
Eosinophils Absolute: 0.3 10*3/uL (ref 0.0–0.5)
Eosinophils Relative: 3 %
HCT: 32.5 % — ABNORMAL LOW (ref 39.0–52.0)
Hemoglobin: 10.4 g/dL — ABNORMAL LOW (ref 13.0–17.0)
Immature Granulocytes: 0 %
Lymphocytes Relative: 6 %
Lymphs Abs: 0.6 10*3/uL — ABNORMAL LOW (ref 0.7–4.0)
MCH: 31.6 pg (ref 26.0–34.0)
MCHC: 32 g/dL (ref 30.0–36.0)
MCV: 98.8 fL (ref 80.0–100.0)
Monocytes Absolute: 0.5 10*3/uL (ref 0.1–1.0)
Monocytes Relative: 5 %
Neutro Abs: 8.9 10*3/uL — ABNORMAL HIGH (ref 1.7–7.7)
Neutrophils Relative %: 86 %
Platelet Count: 417 10*3/uL — ABNORMAL HIGH (ref 150–400)
RBC: 3.29 MIL/uL — ABNORMAL LOW (ref 4.22–5.81)
RDW: 15.1 % (ref 11.5–15.5)
WBC Count: 10.4 10*3/uL (ref 4.0–10.5)
nRBC: 0 % (ref 0.0–0.2)

## 2022-08-08 LAB — CMP (CANCER CENTER ONLY)
ALT: 16 U/L (ref 0–44)
AST: 19 U/L (ref 15–41)
Albumin: 3.4 g/dL — ABNORMAL LOW (ref 3.5–5.0)
Alkaline Phosphatase: 149 U/L — ABNORMAL HIGH (ref 38–126)
Anion gap: 10 (ref 5–15)
BUN: 22 mg/dL (ref 8–23)
CO2: 27 mmol/L (ref 22–32)
Calcium: 8.7 mg/dL — ABNORMAL LOW (ref 8.9–10.3)
Chloride: 101 mmol/L (ref 98–111)
Creatinine: 1.57 mg/dL — ABNORMAL HIGH (ref 0.61–1.24)
GFR, Estimated: 41 mL/min — ABNORMAL LOW (ref >60)
Glucose, Bld: 107 mg/dL — ABNORMAL HIGH (ref 70–99)
Potassium: 3.6 mmol/L (ref 3.5–5.1)
Sodium: 138 mmol/L (ref 135–145)
Total Bilirubin: 0.8 mg/dL (ref 0.3–1.2)
Total Protein: 7.8 g/dL (ref 6.5–8.1)

## 2022-08-08 LAB — RETICULOCYTES
Immature Retic Fract: 11.2 % (ref 2.3–15.9)
RBC.: 3.22 MIL/uL — ABNORMAL LOW (ref 4.22–5.81)
Retic Count, Absolute: 64.4 10*3/uL (ref 19.0–186.0)
Retic Ct Pct: 2 % (ref 0.4–3.1)

## 2022-08-08 LAB — DIRECT ANTIGLOBULIN TEST (NOT AT ARMC)
DAT, IgG: NEGATIVE
DAT, complement: NEGATIVE

## 2022-08-08 LAB — VITAMIN B12: Vitamin B-12: 1352 pg/mL — ABNORMAL HIGH (ref 180–914)

## 2022-08-08 LAB — FERRITIN: Ferritin: 83 ng/mL (ref 24–336)

## 2022-08-08 LAB — FOLATE: Folate: 24.5 ng/mL (ref >5.9)

## 2022-08-09 LAB — KAPPA/LAMBDA LIGHT CHAINS
Kappa free light chain: 839.7 mg/L — ABNORMAL HIGH (ref 3.3–19.4)
Kappa, lambda light chain ratio: 53.15 — ABNORMAL HIGH (ref 0.26–1.65)
Lambda free light chains: 15.8 mg/L (ref 5.7–26.3)

## 2022-08-09 LAB — TESTOSTERONE: Testosterone: 444 ng/dL (ref 264–916)

## 2022-08-09 LAB — HAPTOGLOBIN: Haptoglobin: 283 mg/dL (ref 38–329)

## 2022-08-10 ENCOUNTER — Other Ambulatory Visit: Payer: Self-pay

## 2022-08-10 DIAGNOSIS — N183 Chronic kidney disease, stage 3 unspecified: Secondary | ICD-10-CM

## 2022-08-10 DIAGNOSIS — D539 Nutritional anemia, unspecified: Secondary | ICD-10-CM

## 2022-08-12 LAB — MULTIPLE MYELOMA PANEL, SERUM
Albumin SerPl Elph-Mcnc: 3.2 g/dL (ref 2.9–4.4)
Albumin/Glob SerPl: 0.9 (ref 0.7–1.7)
Alpha 1: 0.4 g/dL (ref 0.0–0.4)
Alpha2 Glob SerPl Elph-Mcnc: 0.9 g/dL (ref 0.4–1.0)
B-Globulin SerPl Elph-Mcnc: 1 g/dL (ref 0.7–1.3)
Gamma Glob SerPl Elph-Mcnc: 1.4 g/dL (ref 0.4–1.8)
Globulin, Total: 3.7 g/dL (ref 2.2–3.9)
IgA: 265 mg/dL (ref 61–437)
IgG (Immunoglobin G), Serum: 1600 mg/dL (ref 603–1613)
IgM (Immunoglobulin M), Srm: 13 mg/dL — ABNORMAL LOW (ref 15–143)
M Protein SerPl Elph-Mcnc: 1 g/dL — ABNORMAL HIGH
Total Protein ELP: 6.9 g/dL (ref 6.0–8.5)

## 2022-09-12 ENCOUNTER — Ambulatory Visit: Payer: Medicare PPO

## 2022-09-13 MED FILL — Ferumoxytol Inj 510 MG/17ML (30 MG/ML) (Elemental Fe): INTRAVENOUS | Qty: 17 | Status: AC

## 2022-09-14 ENCOUNTER — Inpatient Hospital Stay: Payer: Medicare PPO | Attending: Oncology

## 2022-09-14 VITALS — BP 118/70 | HR 70 | Temp 98.2°F | Resp 18

## 2022-09-14 DIAGNOSIS — D539 Nutritional anemia, unspecified: Secondary | ICD-10-CM

## 2022-09-14 DIAGNOSIS — D649 Anemia, unspecified: Secondary | ICD-10-CM | POA: Diagnosis not present

## 2022-09-14 DIAGNOSIS — Z79899 Other long term (current) drug therapy: Secondary | ICD-10-CM | POA: Insufficient documentation

## 2022-09-14 DIAGNOSIS — Z23 Encounter for immunization: Secondary | ICD-10-CM | POA: Diagnosis not present

## 2022-09-14 MED ORDER — LORATADINE 10 MG PO TABS
10.0000 mg | ORAL_TABLET | Freq: Once | ORAL | Status: AC
  Administered 2022-09-14: 10 mg via ORAL
  Filled 2022-09-14: qty 1

## 2022-09-14 MED ORDER — ACETAMINOPHEN 325 MG PO TABS
650.0000 mg | ORAL_TABLET | Freq: Once | ORAL | Status: AC
  Administered 2022-09-14: 650 mg via ORAL
  Filled 2022-09-14: qty 2

## 2022-09-14 MED ORDER — SODIUM CHLORIDE 0.9 % IV SOLN
510.0000 mg | Freq: Once | INTRAVENOUS | Status: AC
  Administered 2022-09-14: 510 mg via INTRAVENOUS
  Filled 2022-09-14: qty 510

## 2022-09-14 MED ORDER — SODIUM CHLORIDE 0.9 % IV SOLN: Freq: Once | INTRAVENOUS | Status: AC

## 2022-09-14 NOTE — Patient Instructions (Signed)
Iron Deficiency Anemia, Adult  Iron deficiency anemia is when you do not have enough red blood cells or hemoglobin in your blood. This happens because you have too little iron in your body. Hemoglobin carries oxygen to parts of the body. Anemia can cause your body to not get enough oxygen. What are the causes? Not eating enough foods that have iron in them. The body not being able to take in iron well. Blood loss. What increases the risk? Having menstrual periods. Being pregnant. What are the signs or symptoms? Pale skin, lips, and nails. Weakness, dizziness, and getting tired easily. Feeling like you cannot breathe well when moving (shortness of breath). Cold hands and feet. Mild anemia may not cause any symptoms. How is this treated? This condition is treated by finding out why you do not have enough iron and then getting more iron. It may include: Adding foods to your diet that have a lot of iron. Taking iron pills (supplements). If you are pregnant or breastfeeding, you may need to take extra iron. Your diet often does not provide the amount of iron that you need. Getting more vitamin C in your diet. Vitamin C helps your body take in iron. You may need to take iron pills with a glass of orange juice or vitamin C pills. Medicines to make heavy menstrual periods lighter. Surgery or testing procedures to find what is causing the condition. You may need blood tests to see if treatment is working. If the treatment does not seem to be working, you may need more tests. Follow these instructions at home: Medicines Take over-the-counter and prescription medicines only as told by your doctor. This includes iron pills and vitamins. Taking them as told is important because too much iron can be harmful. Take iron pills when your stomach is empty. If you cannot handle this, take them with food. Do not drink milk or take antacids at the same time as your iron pills. Iron pills may turn your poop  (stool)black. If you cannot handle taking iron pills by mouth, ask your doctor about getting iron through: An IV tube. A shot (injection) into a muscle. Eating and drinking Talk with your doctor before changing the foods you eat. Your doctor may tell you to eat foods that have a lot of iron, such as: Liver. Low-fat (lean) beef. Breads and cereals that have iron added to them. Eggs. Dried fruit. Dark green, leafy vegetables. Eat fresh fruits and vegetables that are high in vitamin C. They help your body use iron. Foods with a lot of vitamin C include: Oranges. Peppers. Tomatoes. Mangoes. Managing constipation If you are taking iron pills, they may cause trouble pooping (constipation). To prevent or treat this, you may need to: Drink enough fluid to keep your pee (urine) pale yellow. Take over-the-counter or prescription medicines. Eat foods that are high in fiber. These include beans, whole grains, and fresh fruits and vegetables. Limit foods that are high in fat and sugar. These include fried or sweet foods. General instructions Return to your normal activities when your doctor says that it is safe. Keep all follow-up visits. Contact a doctor if: You feel like you may vomit (nauseous), or you vomit. You feel weak. You get light-headed when getting up from sitting or lying down. You are sweating for no reason. You have trouble pooping. You have worse breathing with physical activity. You have heaviness in your chest. Get help right away if: You faint. If this happens, do not drive yourself   to the hospital. You have a fast heartbeat, or a heartbeat that does not feel regular. Summary Iron deficiency anemia happens when you have too little iron in your body. This condition is treated by finding out why you do not have enough iron in your body and then getting more iron. Take over-the-counter and prescription medicines only as told by your doctor. Eat fresh fruits and vegetables  that are high in vitamin C. Contact a doctor if you have trouble pooping or feel weak. This information is not intended to replace advice given to you by your health care provider. Make sure you discuss any questions you have with your health care provider. Document Revised: 11/25/2021 Document Reviewed: 11/25/2021 Elsevier Patient Education  [place]. Iron-Rich Diet  Iron is a mineral that helps your body produce hemoglobin. Hemoglobin is a protein in red blood cells that carries oxygen to your body's tissues. Eating too little iron may cause you to feel weak and tired, and it can increase your risk of infection. Iron is naturally found in many foods, and many foods have iron added to them (are iron-fortified). You may need to follow an iron-rich diet if you do not have enough iron in your body due to certain medical conditions. The amount of iron that you need each day depends on your age, your sex, and any medical conditions you have. Follow instructions from your health care provider or a dietitian about how much iron you should eat each day. What are tips for following this plan? Reading food labels Check food labels to see how many milligrams (mg) of iron are in each serving. Cooking Cook foods in pots and pans that are made from iron. Take these steps to make it easier for your body to absorb iron from certain foods: Soak beans overnight before cooking. Soak whole grains overnight and drain them before using. Ferment flours before baking, such as by using yeast in bread dough. Meal planning When you eat foods that contain iron, you should eat them with foods that are high in vitamin C. These include oranges, peppers, tomatoes, potatoes, and mangoes. Vitamin C helps your body absorb iron. Certain foods and drinks prevent your body from absorbing iron properly. Avoid eating these foods in the same meal as iron-rich foods or with iron supplements. These foods include: Coffee, black  tea, and red wine. Milk, dairy products, and foods that are high in calcium. Beans and soybeans. Whole grains. General information Take iron supplements only as told by your health care provider. An overdose of iron can be life-threatening. If you were prescribed iron supplements, take them with orange juice or a vitamin C supplement. When you eat iron-fortified foods or take an iron supplement, you should also eat foods that naturally contain iron, such as meat, poultry, and fish. Eating naturally iron-rich foods helps your body absorb the iron that is added to other foods or contained in a supplement. Iron from animal sources is better absorbed than iron from plant sources. What foods should I eat? Fruits Prunes. Raisins. Eat fruits high in vitamin C, such as oranges, grapefruits, and strawberries, with iron-rich foods. Vegetables Spinach (cooked). Green peas. Broccoli. Fermented vegetables. Eat vegetables high in vitamin C, such as leafy greens, potatoes, bell peppers, and tomatoes, with iron-rich foods. Grains Iron-fortified breakfast cereal. Iron-fortified whole-wheat bread. Enriched rice. Sprouted grains. Meats and other proteins Beef liver. Beef. [place]. Chicken. Oysters. Shrimp. [place]. Sardines. Chickpeas. Nuts. Tofu. Pumpkin seeds. Beverages Tomato juice. Fresh orange juice.  Prune juice. Hibiscus tea. Iron-fortified instant breakfast shakes. Sweets and desserts Blackstrap molasses. Seasonings and condiments Tahini. Fermented soy sauce. Other foods Wheat germ. The items listed above may not be a complete list of recommended foods and beverages. Contact a dietitian for more information. What foods should I limit? These are foods that should be limited while eating iron-rich foods as they can reduce the absorption of iron in your body. Grains Whole grains. Bran cereal. Bran flour. Meats and other proteins Soybeans. Products made from soy protein. Black beans. Lentils. Mung  beans. Split peas. Dairy Milk. Cream. Cheese. Yogurt. Cottage cheese. Beverages Coffee. Black tea. Red wine. Sweets and desserts Cocoa. Chocolate. Ice cream. Seasonings and condiments Basil. Oregano. Large amounts of parsley. The items listed above may not be a complete list of foods and beverages you should limit. Contact a dietitian for more information. Summary Iron is a mineral that helps your body produce hemoglobin. Hemoglobin is a protein in red blood cells that carries oxygen to your body's tissues. Iron is naturally found in many foods, and many foods have iron added to them (are iron-fortified). When you eat foods that contain iron, you should eat them with foods that are high in vitamin C. Vitamin C helps your body absorb iron. Certain foods and drinks prevent your body from absorbing iron properly, such as whole grains and dairy products. You should avoid eating these foods in the same meal as iron-rich foods or with iron supplements. This information is not intended to replace advice given to you by your health care provider. Make sure you discuss any questions you have with your health care provider. Document Revised: 09/28/2020 Document Reviewed: 09/28/2020 Elsevier Patient Education  [place].

## 2022-09-16 MED FILL — Ferumoxytol Inj 510 MG/17ML (30 MG/ML) (Elemental Fe): INTRAVENOUS | Qty: 17 | Status: AC

## 2022-09-19 ENCOUNTER — Inpatient Hospital Stay: Payer: Medicare PPO

## 2022-09-19 VITALS — BP 106/75 | HR 56 | Temp 98.4°F | Resp 16

## 2022-09-19 DIAGNOSIS — Z79899 Other long term (current) drug therapy: Secondary | ICD-10-CM | POA: Diagnosis not present

## 2022-09-19 DIAGNOSIS — D539 Nutritional anemia, unspecified: Secondary | ICD-10-CM

## 2022-09-19 DIAGNOSIS — D649 Anemia, unspecified: Secondary | ICD-10-CM | POA: Diagnosis not present

## 2022-09-19 DIAGNOSIS — Z23 Encounter for immunization: Secondary | ICD-10-CM | POA: Diagnosis not present

## 2022-09-19 MED ORDER — SODIUM CHLORIDE 0.9 % IV SOLN
510.0000 mg | Freq: Once | INTRAVENOUS | Status: AC
  Administered 2022-09-19: 510 mg via INTRAVENOUS
  Filled 2022-09-19: qty 510

## 2022-09-19 MED ORDER — LORATADINE 10 MG PO TABS: 10.0000 mg | ORAL_TABLET | Freq: Once | ORAL | Status: DC

## 2022-09-19 MED ORDER — SODIUM CHLORIDE 0.9 % IV SOLN: Freq: Once | INTRAVENOUS | Status: AC

## 2022-09-19 MED ORDER — ACETAMINOPHEN 325 MG PO TABS: 650.0000 mg | ORAL_TABLET | Freq: Once | ORAL | Status: DC

## 2022-09-19 MED ORDER — INFLUENZA VAC SPLIT QUAD 0.5 ML IM SUSY
0.5000 mL | PREFILLED_SYRINGE | Freq: Once | INTRAMUSCULAR | Status: AC
  Administered 2022-09-19: 0.5 mL via INTRAMUSCULAR
  Filled 2022-09-19: qty 0.5

## 2022-09-19 NOTE — Patient Instructions (Signed)
Influenza (Flu) Vaccine (Inactivated or Recombinant): What You Need to Know 1. Why get vaccinated? Influenza vaccine can prevent influenza (flu). Flu is a contagious disease that spreads around the [place] every year, usually between October and May. Anyone can get the flu, but it is more dangerous for some people. Infants and young children, people 65 years and older, pregnant people, and people with certain health conditions or a weakened immune system are at greatest risk of flu complications. Pneumonia, bronchitis, sinus infections, and ear infections are examples of flu-related complications. If you have a medical condition, such as heart disease, cancer, or diabetes, flu can make it worse. Flu can cause fever and chills, sore throat, muscle aches, fatigue, cough, headache, and runny or stuffy nose. Some people may have vomiting and diarrhea, though this is more common in children than adults. In an average year, thousands of people in the [place] die from flu, and many more are hospitalized. Flu vaccine prevents millions of illnesses and flu-related visits to the doctor each year. 2. Influenza vaccines CDC recommends everyone 6 months and older get vaccinated every flu season. Children 6 months through 8 years of age may need 2 doses during a single flu season. Everyone else needs only 1 dose each flu season. It takes about 2 weeks for protection to develop after vaccination. There are many flu viruses, and they are always changing. Each year a new flu vaccine is made to protect against the influenza viruses believed to be likely to cause disease in the upcoming flu season. Even when the vaccine doesn't exactly match these viruses, it may still provide some protection. Influenza vaccine does not cause flu. Influenza vaccine may be given at the same time as other vaccines. 3. Talk with your health care provider Tell your vaccination provider if the person getting the vaccine: Has had  an allergic reaction after a previous dose of influenza vaccine, or has any severe, life-threatening allergies Has ever had Guillain-Barr Syndrome (also called "GBS") In some cases, your health care provider may decide to postpone influenza vaccination until a future visit. Influenza vaccine can be administered at any time during pregnancy. People who are or will be pregnant during influenza season should receive inactivated influenza vaccine. People with minor illnesses, such as a cold, may be vaccinated. People who are moderately or severely ill should usually wait until they recover before getting influenza vaccine. Your health care provider can give you more information. 4. Risks of a vaccine reaction Soreness, redness, and swelling where the shot is given, fever, muscle aches, and headache can happen after influenza vaccination. There may be a very small increased risk of Guillain-Barr Syndrome (GBS) after inactivated influenza vaccine (the flu shot). Young children who get the flu shot along with pneumococcal vaccine (PCV13) and/or DTaP vaccine at the same time might be slightly more likely to have a seizure caused by fever. Tell your health care provider if a child who is getting flu vaccine has ever had a seizure. People sometimes faint after medical procedures, including vaccination. Tell your provider if you feel dizzy or have vision changes or ringing in the ears. As with any medicine, there is a very remote chance of a vaccine causing a severe allergic reaction, other serious injury, or death. 5. What if there is a serious problem? An allergic reaction could occur after the vaccinated person leaves the clinic. If you see signs of a severe allergic reaction (hives, swelling of the face and throat, difficulty breathing,   a fast heartbeat, dizziness, or weakness), call 9-1-1 and get the person to the nearest hospital. For other signs that concern you, call your health care provider. Adverse  reactions should be reported to the Vaccine Adverse Event Reporting System (VAERS). Your health care provider will usually file this report, or you can do it yourself. Visit the VAERS website at www.vaers.SamedayNews.es or call (539)417-1311. VAERS is only for reporting reactions, and VAERS staff members do not give medical advice. 6. The National Vaccine Injury Compensation Program The [employer] Vaccine Injury Compensation Program (VICP) is a federal program that was created to compensate people who may have been injured by certain vaccines. Claims regarding alleged injury or death due to vaccination have a time limit for filing, which may be as short as two years. Visit the VICP website at GoldCloset.com.ee or call 360 555 8968 to learn about the program and about filing a claim. 7. How can I learn more? Ask your health care provider. Call your local or state health department. Visit the website of the Food and Drug Administration (FDA) for vaccine package inserts and additional information at TraderRating.uy. Contact the Centers for Disease Control and Prevention (CDC): Call (859)841-9552 (1-800-CDC-INFO) or Visit CDC's website at https://gibson.com/. Source: CDC Vaccine Information Statement Inactivated Influenza Vaccine (06/05/2020) This same material is available at http://www.wolf.info/ for no charge. This information is not intended to replace advice given to you by your health care provider. Make sure you discuss any questions you have with your health care provider. Document Revised: 09/14/2021 Document Reviewed: 07/08/2021 Elsevier Patient Education  [place]. Ferumoxytol Injection What is this medication? FERUMOXYTOL (FER ue MOX i tol) treats low levels of iron in your body (iron deficiency anemia). Iron is a mineral that plays an important role in making red blood cells, which carry oxygen from your lungs to the rest of your body. This medicine may be  used for other purposes; ask your health care provider or pharmacist if you have questions. COMMON BRAND NAME(S): Feraheme What should I tell my care team before I take this medication? They need to know if you have any of these conditions: Anemia not caused by low iron levels High levels of iron in the blood Magnetic resonance imaging (MRI) test scheduled An unusual or allergic reaction to iron, other medications, foods, dyes, or preservatives Pregnant or trying to get pregnant Breastfeeding How should I use this medication? This medication is injected into a vein. It is given by your care team in a hospital or clinic setting. Talk to your care team the use of this medication in children. Special care may be needed. Overdosage: If you think you have taken too much of this medicine contact a poison control center or emergency room at once. NOTE: This medicine is only for you. Do not share this medicine with others. What if I miss a dose? It is important not to miss your dose. Call your care team if you are unable to keep an appointment. What may interact with this medication? Other iron products This list may not describe all possible interactions. Give your health care provider a list of all the medicines, herbs, non-prescription drugs, or dietary supplements you use. Also tell them if you smoke, drink alcohol, or use illegal drugs. Some items may interact with your medicine. What should I watch for while using this medication? Visit your care team regularly. Tell your care team if your symptoms do not start to get better or if they get worse. You may  need blood work done while you are taking this medication. You may need to follow a special diet. Talk to your care team. Foods that contain iron include: whole grains/cereals, dried fruits, beans, or peas, leafy green vegetables, and organ meats (liver, kidney). What side effects may I notice from receiving this medication? Side effects that  you should report to your care team as soon as possible: Allergic reactions--skin rash, itching, hives, swelling of the face, lips, tongue, or throat Low blood pressure--dizziness, feeling faint or lightheaded, blurry vision Shortness of breath Side effects that usually do not require medical attention (report to your care team if they continue or are bothersome): Flushing Headache Joint pain Muscle pain Nausea Pain, redness, or irritation at injection site This list may not describe all possible side effects. Call your doctor for medical advice about side effects. You may report side effects to FDA at 1-800-FDA-1088. Where should I keep my medication? This medication is given in a hospital or clinic and will not be stored at home. NOTE: This sheet is a summary. It may not cover all possible information. If you have questions about this medicine, talk to your doctor, pharmacist, or health care provider.  2023 Elsevier/Gold Standard (2021-03-10 00:00:00)

## 2022-10-10 ENCOUNTER — Inpatient Hospital Stay: Payer: Medicare PPO

## 2022-10-10 ENCOUNTER — Telehealth: Payer: Self-pay | Admitting: Oncology

## 2022-10-10 ENCOUNTER — Inpatient Hospital Stay: Payer: Medicare PPO | Attending: Oncology | Admitting: Oncology

## 2022-10-10 VITALS — BP 152/71 | HR 73 | Temp 97.7°F | Resp 18 | Ht 75.0 in | Wt 206.4 lb

## 2022-10-10 DIAGNOSIS — D472 Monoclonal gammopathy: Secondary | ICD-10-CM | POA: Diagnosis not present

## 2022-10-10 DIAGNOSIS — Z79899 Other long term (current) drug therapy: Secondary | ICD-10-CM | POA: Diagnosis not present

## 2022-10-10 DIAGNOSIS — N189 Chronic kidney disease, unspecified: Secondary | ICD-10-CM | POA: Diagnosis not present

## 2022-10-10 DIAGNOSIS — Z7982 Long term (current) use of aspirin: Secondary | ICD-10-CM | POA: Insufficient documentation

## 2022-10-10 DIAGNOSIS — Z7902 Long term (current) use of antithrombotics/antiplatelets: Secondary | ICD-10-CM

## 2022-10-10 DIAGNOSIS — N183 Chronic kidney disease, stage 3 unspecified: Secondary | ICD-10-CM | POA: Diagnosis not present

## 2022-10-10 DIAGNOSIS — I129 Hypertensive chronic kidney disease with stage 1 through stage 4 chronic kidney disease, or unspecified chronic kidney disease: Secondary | ICD-10-CM | POA: Insufficient documentation

## 2022-10-10 DIAGNOSIS — D539 Nutritional anemia, unspecified: Secondary | ICD-10-CM

## 2022-10-10 DIAGNOSIS — D509 Iron deficiency anemia, unspecified: Secondary | ICD-10-CM | POA: Diagnosis not present

## 2022-10-10 LAB — CBC WITH DIFFERENTIAL/PLATELET
Abs Immature Granulocytes: 0.03 10*3/uL (ref 0.00–0.07)
Basophils Absolute: 0 10*3/uL (ref 0.0–0.1)
Basophils Relative: 0 %
Eosinophils Absolute: 0.3 10*3/uL (ref 0.0–0.5)
Eosinophils Relative: 3 %
HCT: 32.7 % — ABNORMAL LOW (ref 39.0–52.0)
Hemoglobin: 10.5 g/dL — ABNORMAL LOW (ref 13.0–17.0)
Immature Granulocytes: 0 %
Lymphocytes Relative: 9 %
Lymphs Abs: 0.8 10*3/uL (ref 0.7–4.0)
MCH: 32.7 pg (ref 26.0–34.0)
MCHC: 32.1 g/dL (ref 30.0–36.0)
MCV: 101.9 fL — ABNORMAL HIGH (ref 80.0–100.0)
Monocytes Absolute: 0.5 10*3/uL (ref 0.1–1.0)
Monocytes Relative: 5 %
Neutro Abs: 7.2 10*3/uL (ref 1.7–7.7)
Neutrophils Relative %: 83 %
Platelets: 356 10*3/uL (ref 150–400)
RBC: 3.21 MIL/uL — ABNORMAL LOW (ref 4.22–5.81)
RDW: 15.9 % — ABNORMAL HIGH (ref 11.5–15.5)
WBC: 8.9 10*3/uL (ref 4.0–10.5)
nRBC: 0 % (ref 0.0–0.2)

## 2022-10-10 LAB — FERRITIN: Ferritin: 337 ng/mL — ABNORMAL HIGH (ref 24–336)

## 2022-10-10 LAB — PSA: Prostatic Specific Antigen: 1.18 ng/mL (ref 0.00–4.00)

## 2022-10-10 LAB — LACTATE DEHYDROGENASE: LDH: 117 U/L (ref 98–192)

## 2022-10-10 NOTE — Progress Notes (Signed)
[name]:  Patient Care Team: Ocie Doyne., MD as PCP - General (Unknown Physician Specialty)  CHIEF COMPLAINTS/PURPOSE OF CONSULTATION:    HISTORY OF PRESENTING ILLNESS: Johnny Leblanc 88 y.o. male is here because of  anemia Medical history notable for arthritis, easy bruising (on Plavix and aspirin) Metamucil, coronary artery disease, diverticulosis, GERD, hyperlipidemia, hypertension, abdominal aortic aneurysm B12 deficiency, BPH  June 13, 2022 vitamin B12 1492 June 28, 2022 WBC 7.5 hemoglobin 10.5 MCV 99 platelet count 328; 73 segs 15 lymphs 7 monos 5 eos  August 03, 2022:  WBC 11.0 hemoglobin 10.2 MCV 95 platelet 594; 76 segs 12 lymphs 7 monos 4 eos 1 basophil Ferritin 86  Chemistry is notable for albumin 3.8 creatinine 1.00  August 08 2022:  [facility] Hematology Consult Patient has been noted to have anemia for about 15 yrs per family.   He has taken iron supplements in the past and these have recently been restarted about a week ago.  Has tolerated them in the past.  No history of blood transfusion or IV iron.  Last colonoscopy was about 20 yrs ago.  Does not recall having undergone and EGD.   Ambulates with walker at home.  Here today in a rollator.  Lives with daughter and can do basic ADL's  Social:  widowed.  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  Began smoking in high school and quit about 50 yrs ago.  Can't recall how much he smoked.  EtOH none  FMH:   Mother died 52 skin cancer Father died 96 CAD Brother died 103 aneurism  WBC 10.4 hemoglobin 10.4 MCV 99 platelet count 417; 86 segs 6 lymphs 5 monos 3 eos Coombs test negative haptoglobin 283   SPEP with IEP demonstrated an M spike of 1 g/dL of IgG kappa monoclonal protein. serum free kappa 840 lambda 15.8 with a kappa lambda 53.15 IgG 1600 IgA 265 IgM 13 B12 1352 folate 24.5 CMP notable for creatinine 1.57 calcium 8.7 albumin 3.2 total protein  7.8  September 14, 2022 Feraheme 510 mg September 19, 2022 Feraheme 510 mg  October 10 2022:  Scheduled follow up for anemia.  Reviewed results of labs.  Feels better since receiving IV iron   Review of Systems  Constitutional:  Negative for chills and fever.       Appetite decreased and has lost weight  HENT:   Positive for hearing loss. Negative for mouth sores and trouble swallowing.   Respiratory:  Positive for cough. Negative for hemoptysis and shortness of breath.        Slight cough productive of clear mucus.    Cardiovascular:  Positive for leg swelling. Negative for chest pain and palpitations.  Gastrointestinal:  Positive for diarrhea. Negative for abdominal pain, blood in stool, constipation, nausea and vomiting.       Stools dark and greenish on oral iron.  Occasional diarrhea  Genitourinary:  Negative for dysuria, frequency and hematuria.   Musculoskeletal:  Negative for arthralgias, back pain and myalgias.  Neurological:  Positive for extremity weakness. Negative for seizures.       No falls.  Uses walker at home  Hematological:  Negative for adenopathy.       Has easy bruising but no bleeding    MEDICAL HISTORY: Past Medical History:  Diagnosis Date   Arthritis    Bruises easily    pt takes Plavix and office told them to continue his  plavix and asa   Constipation    takes Metamucil daily   Coronary artery disease    Diverticulosis    GERD (gastroesophageal reflux disease)    takes Omeprazole daily   H/O hiatal hernia    Hyperlipidemia    takes Lopid daily   Hypertension    takes Maxzide,Procardia,and Metoprolol daily   Nocturia    Stented coronary artery    Urinary frequency     SURGICAL HISTORY: Past Surgical History:  Procedure Laterality Date   ABDOMINAL AORTAGRAM N/A 03/12/2012   Procedure: ABDOMINAL Maxcine Ham;  Surgeon: Conrad Geronimo, MD;  Location: [facility] CATH LAB;  Service: Cardiovascular;  Laterality: N/A;   cataract surgery     bilateral    COLONOSCOPY     CORONARY ANGIOPLASTY  90's/2003/2013   4    SOCIAL HISTORY: Social History   Socioeconomic History   Marital status: Widowed    Spouse name: Not on file   Number of children: 2   Years of education: 85   Highest education level: High school graduate  Occupational History   Not on file  Tobacco Use   Smoking status: Former    Types: Cigarettes    Quit date: 05/22/1981    Years since quitting: 41.4   Smokeless tobacco: Never   Tobacco comments:    quit 30+yrs ago  Vaping Use   Vaping Use: Never used  Substance and Sexual Activity   Alcohol use: No   Drug use: No   Sexual activity: Never  Other Topics Concern   Not on file  Social History Narrative   Not on file   Social Determinants of Health   Financial Resource Strain: Not on file  Food Insecurity: Not on file  Transportation Needs: Not on file  Physical Activity: Not on file  Stress: Not on file  Social Connections: Not on file  Intimate Partner Violence: Not on file    FAMILY HISTORY Family History  Problem Relation Age of Onset   Aneurysm Father    Heart attack Father    Aneurysm Brother    Diabetes Brother    Cancer Mother     ALLERGIES:  is allergic to protonix [pantoprazole].  MEDICATIONS:  Current Outpatient Medications  Medication Sig Dispense Refill   aspirin 325 MG EC tablet Take 325 mg by mouth daily.     brimonidine (ALPHAGAN) 0.2 % ophthalmic solution 2 (two) times daily.     calcium-vitamin D (OSCAL WITH D) 500-5 MG-MCG tablet Take 1 tablet by mouth.     clopidogrel (PLAVIX) 75 MG tablet Take 75 mg by mouth daily.     colesevelam (WELCHOL) 625 MG tablet Take 2,500 mg by mouth 2 (two) times daily with a meal.     ezetimibe (ZETIA) 10 MG tablet Take 10 mg by mouth daily.     Fe Fum-FA-B Cmp-C-Zn-Mg-Mn-Cu (CENTRATEX) 106-1 MG CAPS Take 1 tablet by mouth daily.     ferrous sulfate 324 MG TBEC Take 324 mg by mouth daily with breakfast.     gemfibrozil (LOPID) 600 MG tablet  Take 600 mg by mouth 2 (two) times daily before a meal.     Methylcobalamin (B12-ACTIVE) 1 MG CHEW Chew by mouth.     metoprolol (TOPROL-XL) 200 MG 24 hr tablet Take 200 mg by mouth daily.     NIFEdipine (PROCARDIA XL/ADALAT-CC) 60 MG 24 hr tablet Take 60 mg by mouth daily.     nitroGLYCERIN (NITROSTAT) 0.4 MG SL tablet Place 0.4 mg under  the tongue every 5 (five) minutes as needed. As needed for chest pain.     omeprazole (PRILOSEC) 20 MG capsule Take 20 mg by mouth daily.     prednisoLONE acetate (PRED FORTE) 1 % ophthalmic suspension daily.     psyllium (HYDROCIL/METAMUCIL) 95 % PACK Take 1 packet by mouth daily.     tamsulosin (FLOMAX) 0.4 MG CAPS capsule Take 0.4 mg by mouth at bedtime.     triamterene-hydrochlorothiazide (MAXZIDE-25) 37.5-25 MG per tablet Take 1 tablet by mouth daily.     Vitamin D, Ergocalciferol, (DRISDOL) 1.25 MG (50000 UNIT) CAPS capsule Take 50,000 Units by mouth every 7 (seven) days.     No current facility-administered medications for this visit.    PHYSICAL EXAMINATION:  ECOG PERFORMANCE STATUS: 3 - Symptomatic, >50% confined to bed   Vitals:   10/10/22 0911  BP: (!) 152/71  Pulse: 73  Resp: 18  Temp: 97.7 F (36.5 C)  SpO2: 96%    Filed Weights   10/10/22 0911  Weight: 206 lb 6.4 oz (93.6 kg)     Physical Exam Vitals and nursing note reviewed.  Constitutional:      General: He is not in acute distress.    Appearance: Normal appearance. He is not toxic-appearing or diaphoretic.     Comments: Here with daughter and nephew.  Seated in rollator  HENT:     Head: Normocephalic and atraumatic.     Right Ear: External ear normal.     Left Ear: External ear normal.     Nose: Nose normal.  Eyes:     General: No scleral icterus.    Conjunctiva/sclera: Conjunctivae normal.     Pupils: Pupils are equal, round, and reactive to light.  Cardiovascular:     Rate and Rhythm: Normal rate and regular rhythm.     Heart sounds: Normal heart sounds. No  murmur heard.    No friction rub. No gallop.  Pulmonary:     Effort: Pulmonary effort is normal. No respiratory distress.     Breath sounds: Normal breath sounds. No stridor. No wheezing, rhonchi or rales.  Chest:     Chest wall: No tenderness.  Abdominal:     General: Bowel sounds are normal. There is no distension.     Palpations: Abdomen is soft. There is no mass.     Tenderness: There is no abdominal tenderness. There is no guarding or rebound.  Musculoskeletal:        General: No signs of injury.     Cervical back: Normal range of motion and neck supple. No rigidity or tenderness.     Right lower leg: No edema.     Left lower leg: No edema.     Comments: Examined seated in chair  Lymphadenopathy:     Head:     Right side of head: No submental, submandibular, tonsillar, preauricular, posterior auricular or occipital adenopathy.     Left side of head: No submental, submandibular, tonsillar, preauricular, posterior auricular or occipital adenopathy.     Cervical: No cervical adenopathy.     Right cervical: No superficial, deep or posterior cervical adenopathy.    Left cervical: No superficial, deep or posterior cervical adenopathy.     Upper Body:     Right upper body: No supraclavicular or axillary adenopathy.     Left upper body: No supraclavicular or axillary adenopathy.     Lower Body: No right inguinal adenopathy. No left inguinal adenopathy.  Skin:    Coloration: Skin  is not jaundiced.  Neurological:     General: No focal deficit present.     Mental Status: He is alert and oriented to person, place, and time. Mental status is at baseline.     Comments: Hard of hearing.  Gait not assessed as patient is in a rollator  Psychiatric:        Mood and Affect: Mood normal.        Behavior: Behavior normal.        Thought Content: Thought content normal.        Judgment: Judgment normal.      LABORATORY DATA: I have personally reviewed the data as listed:  No visits with  results within 1 Month(s) from this visit.  Latest known visit with results is:  Office Visit on 08/08/2022  Component Date Value Ref Range Status   Testosterone 08/08/2022 444  264 - 916 ng/dL Final   Comment: (NOTE) Adult male reference interval is based on a population of healthy nonobese males (BMI <30) between 79 and 68 years old. [place], [place] [phone]. PMID: [id number]. Performed At: [facility] [address] [place], [place] [id number] Rush Farmer MD AY:[id number]     RADIOGRAPHIC STUDIES: I have personally reviewed the radiological images as listed and agree with the findings in the report  No results found.  ASSESSMENT/PLAN  88 y.o. male with medical history notable for arthritis, easy bruising (on Plavix and aspirin)  coronary artery disease, diverticulosis, GERD, hyperlipidemia, hypertension, abdominal aortic aneurysm B12 deficiency, BPH who has a long history of anemia  Anemia:  Main component is iron deficiency due to chronic GI blood loss exacerbated by antiplatelet therapy.  CKD is another likely component.  Will recheck CBC with diff today to assess if he is demonstrating a response to IV iron.  Testosterone level is normal.  Normal haptoglobin indicates hemolysis is unlikely.  Will check PSA  Therapeutics:  Received IV iron without complication  September 14, 2022 Feraheme 510 mg September 19, 2022 Feraheme 510 mg  Monoclonal gammopathy, IgG kappa + kappa:   August 08 2022:  SPEP with IEP 1 g/dL IgG kappa monoclonal protein.  Serum free kappa 840  Will obtain 24 hr UPEP with IEP, Cr clearance to evaluate  Decreased mobility:  Secondary to frail elderly status which may be exacerbated by anemia  Vascular disease:  On ASA and Plavix with likely needs to be continued indefinitely      Cancer Staging  No matching staging information was found for the patient.   No problem-specific Assessment & Plan notes found for this  encounter.   Orders Placed This Encounter  Procedures   CBC with Differential/Platelet   Ferritin    Standing Status:   Future    Standing Expiration Date:   10/11/2023   Lactate dehydrogenase    Standing Status:   Future    Standing Expiration Date:   10/11/2023   Beta 2 microglobulin    Standing Status:   Future    Standing Expiration Date:   10/10/2023   UPEP/UIFE/Light Chains/TP, 24-Hr Ur    Standing Status:   Future    Standing Expiration Date:   10/11/2023   45  minutes was spent in patient care.  This included time spent preparing to see the patient (review of tests), counseling and educating the patient/family/caregiver, ordering tests, or procedures; documenting clinical information in the electronic or other health record, independently interpreting results and communicating results to the patient/family/caregiver as well as coordination  of care.       All questions were answered. The patient knows to call the clinic with any problems, questions or concerns.  This note was electronically signed.    Barbee Cough, MD  10/10/2022 9:21 AM

## 2022-10-10 NOTE — Telephone Encounter (Signed)
10/10/22 Next appt scheduled and confirmed with appt

## 2022-10-11 LAB — BETA 2 MICROGLOBULIN, SERUM: Beta-2 Microglobulin: 5.6 mg/L — ABNORMAL HIGH (ref 0.6–2.4)

## 2022-10-12 ENCOUNTER — Other Ambulatory Visit: Payer: Self-pay

## 2022-10-12 DIAGNOSIS — D509 Iron deficiency anemia, unspecified: Secondary | ICD-10-CM | POA: Diagnosis not present

## 2022-10-12 DIAGNOSIS — Z7982 Long term (current) use of aspirin: Secondary | ICD-10-CM | POA: Diagnosis not present

## 2022-10-12 DIAGNOSIS — N189 Chronic kidney disease, unspecified: Secondary | ICD-10-CM | POA: Diagnosis not present

## 2022-10-12 DIAGNOSIS — I129 Hypertensive chronic kidney disease with stage 1 through stage 4 chronic kidney disease, or unspecified chronic kidney disease: Secondary | ICD-10-CM | POA: Diagnosis not present

## 2022-10-12 DIAGNOSIS — N183 Chronic kidney disease, stage 3 unspecified: Secondary | ICD-10-CM

## 2022-10-12 DIAGNOSIS — Z79899 Other long term (current) drug therapy: Secondary | ICD-10-CM | POA: Diagnosis not present

## 2022-10-12 DIAGNOSIS — D539 Nutritional anemia, unspecified: Secondary | ICD-10-CM

## 2022-10-12 DIAGNOSIS — D472 Monoclonal gammopathy: Secondary | ICD-10-CM | POA: Diagnosis not present

## 2022-10-13 LAB — CREATININE CLEARANCE, URINE, 24 HOUR
Collection Interval-CRCL: 24 hours
Creatinine Clearance: 27 mL/min — ABNORMAL LOW (ref 75–125)
Creatinine, 24H Ur: 565 mg/d — ABNORMAL LOW (ref 800–2000)
Creatinine, Urine: 113 mg/dL
Urine Total Volume-CRCL: 500 mL

## 2022-10-17 ENCOUNTER — Encounter: Payer: Self-pay | Admitting: Oncology

## 2022-10-17 DIAGNOSIS — D472 Monoclonal gammopathy: Secondary | ICD-10-CM | POA: Insufficient documentation

## 2022-10-17 LAB — UPEP/UIFE/LIGHT CHAINS/TP, 24-HR UR
% BETA, Urine: 5 %
ALPHA 1 URINE: 0.7 %
Albumin, U: 14.4 %
Alpha 2, Urine: 3 %
Free Kappa Lt Chains,Ur: 1328.47 mg/L — ABNORMAL HIGH (ref 1.17–86.46)
Free Kappa/Lambda Ratio: 80.91 — ABNORMAL HIGH (ref 1.83–14.26)
Free Lambda Lt Chains,Ur: 16.42 mg/L — ABNORMAL HIGH (ref 0.27–15.21)
GAMMA GLOBULIN URINE: 76.9 %
M-SPIKE %, Urine: 49.2 % — ABNORMAL HIGH
M-Spike, Mg/24 Hr: 274 mg/24 hr — ABNORMAL HIGH
Total Protein, Urine-Ur/day: 556 mg/24 hr — ABNORMAL HIGH (ref 30–150)
Total Protein, Urine: 111.2 mg/dL
Total Volume: 500

## 2022-11-09 ENCOUNTER — Other Ambulatory Visit: Payer: Self-pay

## 2022-11-09 DIAGNOSIS — D539 Nutritional anemia, unspecified: Secondary | ICD-10-CM

## 2022-11-14 ENCOUNTER — Inpatient Hospital Stay: Payer: Medicare PPO | Attending: Oncology

## 2022-11-14 ENCOUNTER — Telehealth: Payer: Self-pay | Admitting: Oncology

## 2022-11-14 ENCOUNTER — Inpatient Hospital Stay (INDEPENDENT_AMBULATORY_CARE_PROVIDER_SITE_OTHER): Payer: Medicare PPO | Admitting: Oncology

## 2022-11-14 VITALS — BP 128/78 | HR 82 | Temp 97.8°F | Resp 18 | Ht 75.0 in | Wt 205.0 lb

## 2022-11-14 DIAGNOSIS — D539 Nutritional anemia, unspecified: Secondary | ICD-10-CM

## 2022-11-14 DIAGNOSIS — D649 Anemia, unspecified: Secondary | ICD-10-CM | POA: Diagnosis not present

## 2022-11-14 DIAGNOSIS — D472 Monoclonal gammopathy: Secondary | ICD-10-CM | POA: Insufficient documentation

## 2022-11-14 LAB — CBC AND DIFFERENTIAL
HCT: 36 — AB (ref 41–53)
Hemoglobin: 12.1 — AB (ref 13.5–17.5)
Neutrophils Absolute: 6.26
Platelets: 308 10*3/uL (ref 150–400)
WBC: 8.7

## 2022-11-14 LAB — CBC: RBC: 3.54 — AB (ref 3.87–5.11)

## 2022-11-14 NOTE — Progress Notes (Signed)
Bone marrow bx and aspirate was planned for this visit however patient was too dyspneic for it to be performed safely.  Procedure cancelled

## 2022-11-14 NOTE — Telephone Encounter (Signed)
Patient has been scheduled for follow-up visit per 11/14/22 LOS.  Pt given an appt calendar with date and time.

## 2022-11-17 ENCOUNTER — Other Ambulatory Visit: Payer: Self-pay

## 2022-11-17 DIAGNOSIS — D472 Monoclonal gammopathy: Secondary | ICD-10-CM | POA: Diagnosis not present

## 2022-11-17 DIAGNOSIS — D509 Iron deficiency anemia, unspecified: Secondary | ICD-10-CM

## 2022-11-17 DIAGNOSIS — D539 Nutritional anemia, unspecified: Secondary | ICD-10-CM

## 2022-11-17 DIAGNOSIS — N183 Chronic kidney disease, stage 3 unspecified: Secondary | ICD-10-CM

## 2022-11-21 LAB — IMMUNOFIXATION, URINE

## 2022-11-28 ENCOUNTER — Ambulatory Visit: Payer: Medicare PPO | Admitting: Oncology

## 2022-12-05 ENCOUNTER — Inpatient Hospital Stay: Payer: Medicare PPO | Attending: Oncology | Admitting: Oncology

## 2022-12-05 ENCOUNTER — Telehealth: Payer: Self-pay | Admitting: Oncology

## 2022-12-05 ENCOUNTER — Inpatient Hospital Stay: Payer: Medicare PPO

## 2022-12-05 VITALS — BP 146/81 | HR 96 | Temp 98.4°F | Resp 18 | Ht 75.0 in | Wt 203.4 lb

## 2022-12-05 DIAGNOSIS — I714 Abdominal aortic aneurysm, without rupture, unspecified: Secondary | ICD-10-CM | POA: Diagnosis not present

## 2022-12-05 DIAGNOSIS — Z79899 Other long term (current) drug therapy: Secondary | ICD-10-CM | POA: Insufficient documentation

## 2022-12-05 DIAGNOSIS — D472 Monoclonal gammopathy: Secondary | ICD-10-CM | POA: Diagnosis not present

## 2022-12-05 DIAGNOSIS — D539 Nutritional anemia, unspecified: Secondary | ICD-10-CM | POA: Diagnosis not present

## 2022-12-05 DIAGNOSIS — Z7902 Long term (current) use of antithrombotics/antiplatelets: Secondary | ICD-10-CM | POA: Insufficient documentation

## 2022-12-05 DIAGNOSIS — R54 Age-related physical debility: Secondary | ICD-10-CM | POA: Diagnosis not present

## 2022-12-05 DIAGNOSIS — N400 Enlarged prostate without lower urinary tract symptoms: Secondary | ICD-10-CM | POA: Diagnosis not present

## 2022-12-05 DIAGNOSIS — Z7982 Long term (current) use of aspirin: Secondary | ICD-10-CM | POA: Insufficient documentation

## 2022-12-05 DIAGNOSIS — N189 Chronic kidney disease, unspecified: Secondary | ICD-10-CM | POA: Insufficient documentation

## 2022-12-05 DIAGNOSIS — K219 Gastro-esophageal reflux disease without esophagitis: Secondary | ICD-10-CM | POA: Insufficient documentation

## 2022-12-05 DIAGNOSIS — I129 Hypertensive chronic kidney disease with stage 1 through stage 4 chronic kidney disease, or unspecified chronic kidney disease: Secondary | ICD-10-CM | POA: Insufficient documentation

## 2022-12-05 DIAGNOSIS — E538 Deficiency of other specified B group vitamins: Secondary | ICD-10-CM | POA: Diagnosis not present

## 2022-12-05 DIAGNOSIS — D509 Iron deficiency anemia, unspecified: Secondary | ICD-10-CM | POA: Diagnosis not present

## 2022-12-05 DIAGNOSIS — Z87891 Personal history of nicotine dependence: Secondary | ICD-10-CM | POA: Insufficient documentation

## 2022-12-05 DIAGNOSIS — E785 Hyperlipidemia, unspecified: Secondary | ICD-10-CM | POA: Insufficient documentation

## 2022-12-05 DIAGNOSIS — N183 Chronic kidney disease, stage 3 unspecified: Secondary | ICD-10-CM

## 2022-12-05 LAB — CBC WITH DIFFERENTIAL/PLATELET
Abs Immature Granulocytes: 0.04 10*3/uL (ref 0.00–0.07)
Basophils Absolute: 0 10*3/uL (ref 0.0–0.1)
Basophils Relative: 0 %
Eosinophils Absolute: 0.2 10*3/uL (ref 0.0–0.5)
Eosinophils Relative: 3 %
HCT: 40.2 % (ref 39.0–52.0)
Hemoglobin: 13.1 g/dL (ref 13.0–17.0)
Immature Granulocytes: 1 %
Lymphocytes Relative: 12 %
Lymphs Abs: 0.9 10*3/uL (ref 0.7–4.0)
MCH: 33.7 pg (ref 26.0–34.0)
MCHC: 32.6 g/dL (ref 30.0–36.0)
MCV: 103.3 fL — ABNORMAL HIGH (ref 80.0–100.0)
Monocytes Absolute: 0.4 10*3/uL (ref 0.1–1.0)
Monocytes Relative: 5 %
Neutro Abs: 5.8 10*3/uL (ref 1.7–7.7)
Neutrophils Relative %: 79 %
Platelets: 424 10*3/uL — ABNORMAL HIGH (ref 150–400)
RBC: 3.89 MIL/uL — ABNORMAL LOW (ref 4.22–5.81)
RDW: 14.1 % (ref 11.5–15.5)
WBC: 7.4 10*3/uL (ref 4.0–10.5)
nRBC: 0 % (ref 0.0–0.2)

## 2022-12-05 LAB — BASIC METABOLIC PANEL
Anion gap: 13 (ref 5–15)
BUN: 20 mg/dL (ref 8–23)
CO2: 25 mmol/L (ref 22–32)
Calcium: 9.1 mg/dL (ref 8.9–10.3)
Chloride: 103 mmol/L (ref 98–111)
Creatinine, Ser: 1.31 mg/dL — ABNORMAL HIGH (ref 0.61–1.24)
GFR, Estimated: 51 mL/min — ABNORMAL LOW (ref >60)
Glucose, Bld: 114 mg/dL — ABNORMAL HIGH (ref 70–99)
Potassium: 4.1 mmol/L (ref 3.5–5.1)
Sodium: 141 mmol/L (ref 135–145)

## 2022-12-05 LAB — FERRITIN: Ferritin: 179 ng/mL (ref 24–336)

## 2022-12-05 NOTE — Telephone Encounter (Signed)
12/05/22 Next appt scheduled and confirmed with patient

## 2022-12-05 NOTE — Progress Notes (Unsigned)
[facility] Initial Visit:  Patient Care Team: Earlyne Iba, NP as PCP - General (Nurse Practitioner)  CHIEF COMPLAINTS/PURPOSE OF CONSULTATION:    HISTORY OF PRESENTING ILLNESS: Johnny Leblanc 89 y.o. male is here because of  anemia Medical history notable for arthritis, easy bruising (on Plavix and aspirin) Metamucil, coronary artery disease, diverticulosis, GERD, hyperlipidemia, hypertension, abdominal aortic aneurysm B12 deficiency, BPH  June 13, 2022 vitamin B12 1492 June 28, 2022 WBC 7.5 hemoglobin 10.5 MCV 99 platelet count 328; 73 segs 15 lymphs 7 monos 5 eos  August 03, 2022:  WBC 11.0 hemoglobin 10.2 MCV 95 platelet 594; 76 segs 12 lymphs 7 monos 4 eos 1 basophil Ferritin 86  Chemistry is notable for albumin 3.8 creatinine 1.00  August 08 2022:  [facility] Hematology Consult Patient has been noted to have anemia for about 15 yrs per family.   He has taken iron supplements in the past and these have recently been restarted about a week ago.  Has tolerated them in the past.  No history of blood transfusion or IV iron.  Last colonoscopy was about 20 yrs ago.  Does not recall having undergone and EGD.   Ambulates with walker at home.  Here today in a rollator.  Lives with daughter and can do basic ADL's  Social:  widowed.  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  Began smoking in high school and quit about 50 yrs ago.  Can't recall how much he smoked.  EtOH none  FMH:   Mother died 47 skin cancer Father died 60 CAD Brother died 73 aneurism  WBC 10.4 hemoglobin 10.4 MCV 99 platelet count 417; 86 segs 6 lymphs 5 monos 3 eos Coombs test negative haptoglobin 283   SPEP with IEP demonstrated an M spike of 1 g/dL of IgG kappa monoclonal protein. serum free kappa 840 lambda 15.8 with a kappa lambda 53.15 IgG 1600 IgA 265 IgM 13 B12 1352 folate 24.5 CMP notable for creatinine 1.57 calcium 8.7 albumin 3.2 total protein 7.8  September 14, 2022 Feraheme 510 mg September 19, 2022 Feraheme 510 mg  October 10 2022:    Reviewed results of labs.  Feels better since receiving IV iron  October 12 2022: 24-hour urine showed loss of 556 mg of total protein of which 274 mg consisted of free light chains with a kappa lambda ratio of 81  November 14 2022:  Bone marrow bx and aspirate attempted but patient too dyspneic to perform  WBC 8.7 hemoglobin 12.1 platelet count 308  November 17, 2022 urine urine immunofixation showed Bence-Jones protein kappa type  December 05 2022:  Scheduled follow up for anemia. Reviewed results of labs from last visit.  Patient frustrated at having to go to numerous medical visits.  He would like to keep procedures and visits to a minimum and adopt a more palliative approach.  Feels OK.  Has lost 2 lbs.  ECOG 2 at best  Review of Systems  Constitutional:  Negative for chills and fever.  HENT:   Positive for hearing loss. Negative for mouth sores and trouble swallowing.   Respiratory:  Positive for cough. Negative for hemoptysis and shortness of breath.        Slight cough productive of clear mucus.    Cardiovascular:  Positive for leg swelling. Negative for chest pain and palpitations.  Gastrointestinal:  Positive for diarrhea. Negative for abdominal pain, blood in stool, constipation, nausea and vomiting.  Stools dark and greenish on oral iron.  Occasional diarrhea  Genitourinary:  Negative for dysuria, frequency and hematuria.   Musculoskeletal:  Negative for arthralgias, back pain and myalgias.  Neurological:  Positive for extremity weakness. Negative for seizures.       No falls.  Uses walker at home  Hematological:  Negative for adenopathy.       Has easy bruising but no bleeding    MEDICAL HISTORY: Past Medical History:  Diagnosis Date   Arthritis    Bruises easily    pt takes Plavix and office told them to continue his plavix and asa   Constipation    takes Metamucil daily   Coronary  artery disease    Diverticulosis    GERD (gastroesophageal reflux disease)    takes Omeprazole daily   H/O hiatal hernia    Hyperlipidemia    takes Lopid daily   Hypertension    takes Maxzide,Procardia,and Metoprolol daily   Nocturia    Stented coronary artery    Urinary frequency     SURGICAL HISTORY: Past Surgical History:  Procedure Laterality Date   ABDOMINAL AORTAGRAM N/A 03/12/2012   Procedure: ABDOMINAL Maxcine Ham;  Surgeon: Conrad Pearsall, MD;  Location: [facility] CATH LAB;  Service: Cardiovascular;  Laterality: N/A;   cataract surgery     bilateral   COLONOSCOPY     CORONARY ANGIOPLASTY  90's/2003/2013   4    SOCIAL HISTORY: Social History   Socioeconomic History   Marital status: Widowed    Spouse name: Not on file   Number of children: 2   Years of education: 50   Highest education level: High school graduate  Occupational History   Not on file  Tobacco Use   Smoking status: Former    Types: Cigarettes    Quit date: 05/22/1981    Years since quitting: 41.5   Smokeless tobacco: Never   Tobacco comments:    quit 30+yrs ago  Vaping Use   Vaping Use: Never used  Substance and Sexual Activity   Alcohol use: No   Drug use: No   Sexual activity: Never  Other Topics Concern   Not on file  Social History Narrative   Not on file   Social Determinants of Health   Financial Resource Strain: Not on file  Food Insecurity: Not on file  Transportation Needs: Not on file  Physical Activity: Not on file  Stress: Not on file  Social Connections: Not on file  Intimate Partner Violence: Not on file    FAMILY HISTORY Family History  Problem Relation Age of Onset   Aneurysm Father    Heart attack Father    Aneurysm Brother    Diabetes Brother    Cancer Mother     ALLERGIES:  is allergic to protonix [pantoprazole].  MEDICATIONS:  Current Outpatient Medications  Medication Sig Dispense Refill   aspirin 325 MG EC tablet Take 325 mg by mouth daily.      brimonidine (ALPHAGAN) 0.2 % ophthalmic solution 2 (two) times daily.     calcium-vitamin D (OSCAL WITH D) 500-5 MG-MCG tablet Take 1 tablet by mouth.     clopidogrel (PLAVIX) 75 MG tablet Take 75 mg by mouth daily.     colesevelam (WELCHOL) 625 MG tablet Take 2,500 mg by mouth 2 (two) times daily with a meal.     ezetimibe (ZETIA) 10 MG tablet Take 10 mg by mouth daily.     Fe Fum-FA-B Cmp-C-Zn-Mg-Mn-Cu (CENTRATEX) 106-1 MG CAPS Take 1  tablet by mouth daily.     ferrous sulfate 324 MG TBEC Take 324 mg by mouth daily with breakfast.     gemfibrozil (LOPID) 600 MG tablet Take 600 mg by mouth 2 (two) times daily before a meal.     Methylcobalamin (B12-ACTIVE) 1 MG CHEW Chew by mouth.     metoprolol (TOPROL-XL) 200 MG 24 hr tablet Take 200 mg by mouth daily.     NIFEdipine (PROCARDIA XL/ADALAT-CC) 60 MG 24 hr tablet Take 60 mg by mouth daily.     nitroGLYCERIN (NITROSTAT) 0.4 MG SL tablet Place 0.4 mg under the tongue every 5 (five) minutes as needed. As needed for chest pain.     omeprazole (PRILOSEC) 20 MG capsule Take 20 mg by mouth daily.     prednisoLONE acetate (PRED FORTE) 1 % ophthalmic suspension daily.     psyllium (HYDROCIL/METAMUCIL) 95 % PACK Take 1 packet by mouth daily.     tamsulosin (FLOMAX) 0.4 MG CAPS capsule Take 0.4 mg by mouth at bedtime.     triamterene-hydrochlorothiazide (MAXZIDE-25) 37.5-25 MG per tablet Take 1 tablet by mouth daily.     Vitamin D, Ergocalciferol, (DRISDOL) 1.25 MG (50000 UNIT) CAPS capsule Take 50,000 Units by mouth every 7 (seven) days.     No current facility-administered medications for this visit.    PHYSICAL EXAMINATION:  ECOG PERFORMANCE STATUS: 3 - Symptomatic, >50% confined to bed   Vitals:   12/05/22 0832  BP: (!) 146/81  Pulse: 96  Resp: 18  Temp: 98.4 F (36.9 C)  SpO2: 96%    Filed Weights   12/05/22 0832  Weight: 203 lb 6.4 oz (92.3 kg)     Physical Exam Vitals and nursing note reviewed.  Constitutional:      General:  He is not in acute distress.    Appearance: Normal appearance. He is not toxic-appearing or diaphoretic.     Comments: Here with daughter.  Seated in chair  HENT:     Head: Normocephalic and atraumatic.     Right Ear: External ear normal.     Left Ear: External ear normal.     Nose: Nose normal.  Eyes:     General: No scleral icterus.    Conjunctiva/sclera: Conjunctivae normal.     Pupils: Pupils are equal, round, and reactive to light.  Cardiovascular:     Rate and Rhythm: Normal rate and regular rhythm.     Heart sounds: Normal heart sounds. No murmur heard.    No friction rub. No gallop.  Pulmonary:     Effort: Pulmonary effort is normal. No respiratory distress.     Breath sounds: Normal breath sounds. No stridor. No wheezing, rhonchi or rales.  Chest:     Chest wall: No tenderness.  Abdominal:     General: Bowel sounds are normal. There is no distension.     Palpations: Abdomen is soft. There is no mass.     Tenderness: There is no abdominal tenderness. There is no guarding or rebound.  Musculoskeletal:        General: No signs of injury.     Cervical back: Normal range of motion and neck supple. No rigidity or tenderness.     Right lower leg: No edema.     Left lower leg: No edema.     Comments: Examined seated in chair  Lymphadenopathy:     Head:     Right side of head: No submental, submandibular, tonsillar, preauricular, posterior auricular or occipital adenopathy.  Left side of head: No submental, submandibular, tonsillar, preauricular, posterior auricular or occipital adenopathy.     Cervical: No cervical adenopathy.     Right cervical: No superficial, deep or posterior cervical adenopathy.    Left cervical: No superficial, deep or posterior cervical adenopathy.     Upper Body:     Right upper body: No supraclavicular or axillary adenopathy.     Left upper body: No supraclavicular or axillary adenopathy.     Lower Body: No right inguinal adenopathy. No left  inguinal adenopathy.  Skin:    Coloration: Skin is not jaundiced.  Neurological:     General: No focal deficit present.     Mental Status: He is alert and oriented to person, place, and time. Mental status is at baseline.     Comments: Hard of hearing.  Gait not assessed as patient is in a rollator  Psychiatric:        Mood and Affect: Mood normal.        Behavior: Behavior normal.        Thought Content: Thought content normal.        Judgment: Judgment normal.     LABORATORY DATA: I have personally reviewed the data as listed:  Orders Only on 11/17/2022  Component Date Value Ref Range Status   Immunofixation, Urine 11/17/2022 Comment (A)   Final   Comment: (NOTE) Bence Jones Protein positive; kappa type. Performed At: [facility] [place], [place] [id number] Rush Farmer MD FA:[id number]   Abstract on 11/15/2022  Component Date Value Ref Range Status   Hemoglobin 11/14/2022 12.1 (A)  13.5 - 17.5 Final   HCT 11/14/2022 36 (A)  41 - 53 Final   Neutrophils Absolute 11/14/2022 6.26   Final   Platelets 11/14/2022 308  150 - 400 K/uL Final   WBC 11/14/2022 8.7   Final   RBC 11/14/2022 3.54 (A)  3.87 - 5.11 Final    RADIOGRAPHIC STUDIES: I have personally reviewed the radiological images as listed and agree with the findings in the report  No results found.  ASSESSMENT/PLAN  89 y.o. male with medical history notable for arthritis, easy bruising (on Plavix and aspirin)  coronary artery disease, diverticulosis, GERD, hyperlipidemia, hypertension, abdominal aortic aneurysm B12 deficiency, BPH who has a long history of anemia  Anemia:  Main component is iron deficiency due to chronic GI blood loss exacerbated by antiplatelet therapy.  CKD another major component.  Testosterone level normal.  Normal haptoglobin indicates hemolysis is unlikely.    November 10 2022:  PSA 1.18  Therapeutics:  Received IV iron without complication  September 14, 2022 Feraheme  510 mg September 19, 2022 Feraheme 510 mg November 14 2022:  Hgb 12.1 indicating a major response to IV iron December 05 2022:  Patient and daughter wish to pursue a palliative approach will therefore not pursue further evaluation of monoclonal gammopathy.  This is reasonable since he is frail elderly  Monoclonal gammopathy, IgG kappa + kappa:   August 08 2022:  SPEP with IEP 1 g/dL IgG kappa monoclonal protein.  Serum free kappa 840 October 12 2022: 24-hour urine showed loss of 556 mg of total protein of which 274 mg consisted of free light chains with a kappa lambda ratio of 81.  Creatinine clearance measured was 27  Decreased mobility:  Secondary to frail elderly status which may be exacerbated by anemia  Vascular disease:  On ASA and Plavix with likely needs to be continued indefinitely  Cancer Staging  No matching staging information was found for the patient.   No problem-specific Assessment & Plan notes found for this encounter.   No orders of the defined types were placed in this encounter.  33  minutes was spent in patient care.  This included time spent preparing to see the patient (review of tests), counseling and educating the patient/family/caregiver, ordering tests, or procedures; documenting clinical information in the electronic or other health record, independently interpreting results and communicating results to the patient/family/caregiver as well as coordination of care.       All questions were answered. The patient knows to call the clinic with any problems, questions or concerns.  This note was electronically signed.    Barbee Cough, MD  12/05/2022 8:46 AM

## 2022-12-07 ENCOUNTER — Encounter: Payer: Self-pay | Admitting: Oncology

## 2022-12-07 DIAGNOSIS — R54 Age-related physical debility: Secondary | ICD-10-CM | POA: Insufficient documentation

## 2023-01-04 DIAGNOSIS — H40012 Open angle with borderline findings, low risk, left eye: Secondary | ICD-10-CM | POA: Diagnosis not present

## 2023-01-10 DIAGNOSIS — E538 Deficiency of other specified B group vitamins: Secondary | ICD-10-CM | POA: Diagnosis not present

## 2023-01-10 DIAGNOSIS — I2510 Atherosclerotic heart disease of native coronary artery without angina pectoris: Secondary | ICD-10-CM | POA: Diagnosis not present

## 2023-01-10 DIAGNOSIS — E559 Vitamin D deficiency, unspecified: Secondary | ICD-10-CM | POA: Diagnosis not present

## 2023-01-10 DIAGNOSIS — E785 Hyperlipidemia, unspecified: Secondary | ICD-10-CM | POA: Diagnosis not present

## 2023-01-10 DIAGNOSIS — I10 Essential (primary) hypertension: Secondary | ICD-10-CM | POA: Diagnosis not present

## 2023-01-10 DIAGNOSIS — N400 Enlarged prostate without lower urinary tract symptoms: Secondary | ICD-10-CM | POA: Diagnosis not present

## 2023-01-10 DIAGNOSIS — Z6826 Body mass index (BMI) 26.0-26.9, adult: Secondary | ICD-10-CM | POA: Diagnosis not present

## 2023-01-10 DIAGNOSIS — I714 Abdominal aortic aneurysm, without rupture, unspecified: Secondary | ICD-10-CM | POA: Diagnosis not present

## 2023-02-06 ENCOUNTER — Inpatient Hospital Stay: Payer: Medicare PPO

## 2023-02-06 ENCOUNTER — Inpatient Hospital Stay: Payer: Medicare PPO | Attending: Oncology | Admitting: Oncology

## 2023-02-06 ENCOUNTER — Encounter: Payer: Self-pay | Admitting: Oncology

## 2023-02-06 VITALS — BP 140/68 | HR 77 | Temp 97.6°F | Resp 20 | Ht 75.0 in | Wt 216.6 lb

## 2023-02-06 DIAGNOSIS — E538 Deficiency of other specified B group vitamins: Secondary | ICD-10-CM | POA: Diagnosis not present

## 2023-02-06 DIAGNOSIS — I129 Hypertensive chronic kidney disease with stage 1 through stage 4 chronic kidney disease, or unspecified chronic kidney disease: Secondary | ICD-10-CM | POA: Insufficient documentation

## 2023-02-06 DIAGNOSIS — I2510 Atherosclerotic heart disease of native coronary artery without angina pectoris: Secondary | ICD-10-CM | POA: Insufficient documentation

## 2023-02-06 DIAGNOSIS — N189 Chronic kidney disease, unspecified: Secondary | ICD-10-CM | POA: Diagnosis not present

## 2023-02-06 DIAGNOSIS — E785 Hyperlipidemia, unspecified: Secondary | ICD-10-CM | POA: Insufficient documentation

## 2023-02-06 DIAGNOSIS — N183 Chronic kidney disease, stage 3 unspecified: Secondary | ICD-10-CM

## 2023-02-06 DIAGNOSIS — D539 Nutritional anemia, unspecified: Secondary | ICD-10-CM

## 2023-02-06 DIAGNOSIS — R54 Age-related physical debility: Secondary | ICD-10-CM

## 2023-02-06 DIAGNOSIS — D509 Iron deficiency anemia, unspecified: Secondary | ICD-10-CM | POA: Insufficient documentation

## 2023-02-06 DIAGNOSIS — N400 Enlarged prostate without lower urinary tract symptoms: Secondary | ICD-10-CM | POA: Insufficient documentation

## 2023-02-06 DIAGNOSIS — D472 Monoclonal gammopathy: Secondary | ICD-10-CM | POA: Insufficient documentation

## 2023-02-06 DIAGNOSIS — Z79899 Other long term (current) drug therapy: Secondary | ICD-10-CM | POA: Diagnosis not present

## 2023-02-06 DIAGNOSIS — Z7982 Long term (current) use of aspirin: Secondary | ICD-10-CM | POA: Insufficient documentation

## 2023-02-06 DIAGNOSIS — Z7902 Long term (current) use of antithrombotics/antiplatelets: Secondary | ICD-10-CM | POA: Insufficient documentation

## 2023-02-06 DIAGNOSIS — Z87891 Personal history of nicotine dependence: Secondary | ICD-10-CM | POA: Insufficient documentation

## 2023-02-06 LAB — CBC WITH DIFFERENTIAL/PLATELET
Abs Immature Granulocytes: 0.02 10*3/uL (ref 0.00–0.07)
Basophils Absolute: 0 10*3/uL (ref 0.0–0.1)
Basophils Relative: 1 %
Eosinophils Absolute: 0.5 10*3/uL (ref 0.0–0.5)
Eosinophils Relative: 7 %
HCT: 35.5 % — ABNORMAL LOW (ref 39.0–52.0)
Hemoglobin: 11.4 g/dL — ABNORMAL LOW (ref 13.0–17.0)
Immature Granulocytes: 0 %
Lymphocytes Relative: 12 %
Lymphs Abs: 0.9 10*3/uL (ref 0.7–4.0)
MCH: 33 pg (ref 26.0–34.0)
MCHC: 32.1 g/dL (ref 30.0–36.0)
MCV: 102.9 fL — ABNORMAL HIGH (ref 80.0–100.0)
Monocytes Absolute: 0.4 10*3/uL (ref 0.1–1.0)
Monocytes Relative: 6 %
Neutro Abs: 5.3 10*3/uL (ref 1.7–7.7)
Neutrophils Relative %: 74 %
Platelets: 344 10*3/uL (ref 150–400)
RBC: 3.45 MIL/uL — ABNORMAL LOW (ref 4.22–5.81)
RDW: 14.5 % (ref 11.5–15.5)
WBC: 7.1 10*3/uL (ref 4.0–10.5)
nRBC: 0 % (ref 0.0–0.2)

## 2023-02-06 LAB — BASIC METABOLIC PANEL
Anion gap: 11 (ref 5–15)
BUN: 19 mg/dL (ref 8–23)
CO2: 26 mmol/L (ref 22–32)
Calcium: 9 mg/dL (ref 8.9–10.3)
Chloride: 102 mmol/L (ref 98–111)
Creatinine, Ser: 1.13 mg/dL (ref 0.61–1.24)
GFR, Estimated: >60 mL/min (ref >60)
Glucose, Bld: 96 mg/dL (ref 70–99)
Potassium: 4.3 mmol/L (ref 3.5–5.1)
Sodium: 139 mmol/L (ref 135–145)

## 2023-02-06 LAB — FERRITIN: Ferritin: 126 ng/mL (ref 24–336)

## 2023-02-06 NOTE — Progress Notes (Signed)
[facility] Cancer Initial Visit:  Patient Care Team: Jim Like, NP as PCP - General (Nurse Practitioner)  CHIEF COMPLAINTS/PURPOSE OF CONSULTATION:    HISTORY OF PRESENTING ILLNESS: Johnny Leblanc 89 y.o. male is here because of  anemia Medical history notable for arthritis, easy bruising (on Plavix and aspirin) Metamucil, coronary artery disease, diverticulosis, GERD, hyperlipidemia, hypertension, abdominal aortic aneurysm B12 deficiency, BPH  June 13, 2022 vitamin B12 1492 June 28, 2022 WBC 7.5 hemoglobin 10.5 MCV 99 platelet count 328; 73 segs 15 lymphs 7 monos 5 eos  August 03, 2022:  WBC 11.0 hemoglobin 10.2 MCV 95 platelet 594; 76 segs 12 lymphs 7 monos 4 eos 1 basophil Ferritin 86  Chemistry is notable for albumin 3.8 creatinine 1.00  August 08 2022:  [facility] Hematology Consult Patient has been noted to have anemia for about 15 yrs per family.   He has taken iron supplements in the past and these have recently been restarted about a week ago.  Has tolerated them in the past.  No history of blood transfusion or IV iron.  Last colonoscopy was about 20 yrs ago.  Does not recall having undergone and EGD.   Ambulates with walker at home.  Here today in a rollator.  Lives with daughter and can do basic ADL's  Social:  widowed.  Worked in 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  Began smoking in high school and quit about 50 yrs ago.  Can't recall how much he smoked.  EtOH none  FMH:   Mother died 29 skin cancer Father died 69 CAD Brother died 32 aneurism  WBC 10.4 hemoglobin 10.4 MCV 99 platelet count 417; 86 segs 6 lymphs 5 monos 3 eos Coombs test negative haptoglobin 283   SPEP with IEP demonstrated an M spike of 1 g/dL of IgG kappa monoclonal protein. serum free kappa 840 lambda 15.8 with a kappa lambda 53.15 IgG 1600 IgA 265 IgM 13 B12 1352 folate 24.5 CMP notable for creatinine 1.57 calcium 8.7 albumin 3.2 total protein 7.8  September 14, 2022 Feraheme 510 mg September 19, 2022 Feraheme 510 mg  October 10 2022:    Reviewed results of labs.  Feels better since receiving IV iron  October 12 2022: 24-hour urine showed loss of 556 mg of total protein of which 274 mg consisted of free light chains with a kappa lambda ratio of 81  November 14 2022:  Bone marrow bx and aspirate attempted but patient too dyspneic to perform  WBC 8.7 hemoglobin 12.1 platelet count 308  November 17, 2022 urine urine immunofixation showed Bence-Jones protein kappa type  December 05 2022:  Reviewed results of labs with patient and daughter.  Patient frustrated at having to go to numerous medical visits.  He would like to keep procedures and visits to a minimum and adopt a more palliative approach.  Feels OK.  Has lost 2 lbs.  ECOG 2 at best  WBC 7.4 hemoglobin 13.1 MCV 103 count 424; 79 segs 12 lymphs 5 monos 3 eos Cr 1.3  Ferritin 179  February 06 2023:  Scheduled follow up for anemia.  Feels well overall.  Spends much of his time sitting but has been doing a bit more Hgb 11.4 MCV 103  Review of Systems  Constitutional:  Negative for chills and fever.  HENT:   Positive for hearing loss. Negative for mouth sores and trouble swallowing.   Respiratory:  Positive for cough. Negative for hemoptysis  and shortness of breath.        Slight cough productive of clear mucus.    Cardiovascular:  Positive for leg swelling. Negative for chest pain and palpitations.  Gastrointestinal:  Positive for diarrhea. Negative for abdominal pain, blood in stool, constipation, nausea and vomiting.       Stools dark and greenish on oral iron.  Occasional diarrhea  Genitourinary:  Negative for dysuria, frequency and hematuria.   Musculoskeletal:  Negative for arthralgias, back pain and myalgias.  Neurological:  Positive for extremity weakness. Negative for seizures.       No falls.  Uses walker at home  Hematological:  Negative for adenopathy.       Has easy bruising but no  bleeding    MEDICAL HISTORY: Past Medical History:  Diagnosis Date   Arthritis    Bruises easily    pt takes Plavix and office told them to continue his plavix and asa   Constipation    takes Metamucil daily   Coronary artery disease    Diverticulosis    GERD (gastroesophageal reflux disease)    takes Omeprazole daily   H/O hiatal hernia    Hyperlipidemia    takes Lopid daily   Hypertension    takes Maxzide,Procardia,and Metoprolol daily   Nocturia    Stented coronary artery    Urinary frequency     SURGICAL HISTORY: Past Surgical History:  Procedure Laterality Date   ABDOMINAL AORTAGRAM N/A 03/12/2012   Procedure: ABDOMINAL Ronny Flurry;  Surgeon: Fransisco Hertz, MD;  Location: [facility] CATH LAB;  Service: Cardiovascular;  Laterality: N/A;   cataract surgery     bilateral   COLONOSCOPY     CORONARY ANGIOPLASTY  90's/2003/2013   4    SOCIAL HISTORY: Social History   Socioeconomic History   Marital status: Widowed    Spouse name: Not on file   Number of children: 2   Years of education: 85   Highest education level: High school graduate  Occupational History   Not on file  Tobacco Use   Smoking status: Former    Types: Cigarettes    Quit date: 05/22/1981    Years since quitting: 41.7   Smokeless tobacco: Never   Tobacco comments:    quit 30+yrs ago  Vaping Use   Vaping Use: Never used  Substance and Sexual Activity   Alcohol use: No   Drug use: No   Sexual activity: Never  Other Topics Concern   Not on file  Social History Narrative   Not on file   Social Determinants of Health   Financial Resource Strain: Not on file  Food Insecurity: Not on file  Transportation Needs: Not on file  Physical Activity: Not on file  Stress: Not on file  Social Connections: Not on file  Intimate Partner Violence: Not on file    FAMILY HISTORY Family History  Problem Relation Age of Onset   Aneurysm Father    Heart attack Father    Aneurysm Brother    Diabetes Brother     Cancer Mother     ALLERGIES:  is allergic to protonix [pantoprazole].  MEDICATIONS:  Current Outpatient Medications  Medication Sig Dispense Refill   metoprolol succinate (TOPROL-XL) 100 MG 24 hr tablet Take 100 mg by mouth daily.     aspirin 325 MG EC tablet Take 325 mg by mouth daily.     brimonidine (ALPHAGAN) 0.2 % ophthalmic solution 2 (two) times daily.     calcium-vitamin D (OSCAL WITH  D) 500-5 MG-MCG tablet Take 1 tablet by mouth.     clopidogrel (PLAVIX) 75 MG tablet Take 75 mg by mouth daily.     colesevelam (WELCHOL) 625 MG tablet Take 2,500 mg by mouth 2 (two) times daily with a meal.     ezetimibe (ZETIA) 10 MG tablet Take 10 mg by mouth daily.     Fe Fum-FA-B Cmp-C-Zn-Mg-Mn-Cu (CENTRATEX) 106-1 MG CAPS Take 1 tablet by mouth daily.     ferrous sulfate 324 MG TBEC Take 324 mg by mouth daily with breakfast.     gemfibrozil (LOPID) 600 MG tablet Take 600 mg by mouth 2 (two) times daily before a meal.     Methylcobalamin (B12-ACTIVE) 1 MG CHEW Chew by mouth.     NIFEdipine (PROCARDIA XL/ADALAT-CC) 60 MG 24 hr tablet Take 60 mg by mouth daily.     nitroGLYCERIN (NITROSTAT) 0.4 MG SL tablet Place 0.4 mg under the tongue every 5 (five) minutes as needed. As needed for chest pain.     omeprazole (PRILOSEC) 20 MG capsule Take 20 mg by mouth daily.     prednisoLONE acetate (PRED FORTE) 1 % ophthalmic suspension daily.     psyllium (HYDROCIL/METAMUCIL) 95 % PACK Take 1 packet by mouth daily.     tamsulosin (FLOMAX) 0.4 MG CAPS capsule Take 0.4 mg by mouth at bedtime.     triamterene-hydrochlorothiazide (MAXZIDE-25) 37.5-25 MG per tablet Take 1 tablet by mouth daily.     Vitamin D, Ergocalciferol, (DRISDOL) 1.25 MG (50000 UNIT) CAPS capsule Take 50,000 Units by mouth every 7 (seven) days.     No current facility-administered medications for this visit.    PHYSICAL EXAMINATION:  ECOG PERFORMANCE STATUS: 3 - Symptomatic, >50% confined to bed   Vitals:   02/06/23 0833  Pulse:  77  Resp: 20  SpO2: 98%    Filed Weights   02/06/23 0833  Weight: 216 lb 9.6 oz (98.2 kg)     Physical Exam Vitals and nursing note reviewed.  Constitutional:      General: He is not in acute distress.    Appearance: Normal appearance. He is not toxic-appearing or diaphoretic.     Comments: Here with daughter.  Seated in chair  HENT:     Head: Normocephalic and atraumatic.     Right Ear: External ear normal.     Left Ear: External ear normal.     Nose: Nose normal.  Eyes:     General: No scleral icterus.    Conjunctiva/sclera: Conjunctivae normal.     Pupils: Pupils are equal, round, and reactive to light.  Cardiovascular:     Rate and Rhythm: Normal rate and regular rhythm.     Heart sounds: Normal heart sounds. No murmur heard.    No friction rub. No gallop.  Pulmonary:     Effort: Pulmonary effort is normal. No respiratory distress.     Breath sounds: Normal breath sounds. No stridor. No wheezing, rhonchi or rales.  Chest:     Chest wall: No tenderness.  Abdominal:     General: Bowel sounds are normal. There is no distension.     Palpations: Abdomen is soft. There is no mass.     Tenderness: There is no abdominal tenderness. There is no guarding or rebound.  Musculoskeletal:        General: No signs of injury.     Cervical back: Normal range of motion and neck supple. No rigidity or tenderness.     Right lower leg:  No edema.     Left lower leg: No edema.     Comments: Examined seated in chair  Lymphadenopathy:     Head:     Right side of head: No submental, submandibular, tonsillar, preauricular, posterior auricular or occipital adenopathy.     Left side of head: No submental, submandibular, tonsillar, preauricular, posterior auricular or occipital adenopathy.     Cervical: No cervical adenopathy.     Right cervical: No superficial, deep or posterior cervical adenopathy.    Left cervical: No superficial, deep or posterior cervical adenopathy.     Upper Body:      Right upper body: No supraclavicular or axillary adenopathy.     Left upper body: No supraclavicular or axillary adenopathy.     Lower Body: No right inguinal adenopathy. No left inguinal adenopathy.  Skin:    Coloration: Skin is not jaundiced.  Neurological:     General: No focal deficit present.     Mental Status: He is alert and oriented to person, place, and time. Mental status is at baseline.     Comments: Hard of hearing.  Gait not assessed as patient is in a rollator  Psychiatric:        Mood and Affect: Mood normal.        Behavior: Behavior normal.        Thought Content: Thought content normal.        Judgment: Judgment normal.      LABORATORY DATA: I have personally reviewed the data as listed:  No visits with results within 1 Month(s) from this visit.  Latest known visit with results is:  Appointment on 12/05/2022  Component Date Value Ref Range Status   Ferritin 12/05/2022 179  24 - 336 ng/mL Final   Performed at [facility], [address] [place], [place] [address]    RADIOGRAPHIC STUDIES: I have personally reviewed the radiological images as listed and agree with the findings in the report  No results found.  ASSESSMENT/PLAN  89 y.o. male with medical history notable for arthritis, easy bruising (on Plavix and aspirin)  coronary artery disease, diverticulosis, GERD, hyperlipidemia, hypertension, abdominal aortic aneurysm B12 deficiency, BPH who has a long history of anemia  Anemia:  Main component is iron deficiency due to chronic GI blood loss exacerbated by antiplatelet therapy.  CKD another major component.  Testosterone level normal.  Normal haptoglobin indicates hemolysis is unlikely.    November 10 2022:  PSA 1.18  Therapeutics:  Received IV iron without complication  September 14, 2022 Feraheme 510 mg September 19, 2022 Feraheme 510 mg November 14 2022:  Hgb 12.1 indicating a major response to IV iron December 05 2022:  Patient and  daughter wish to pursue a palliative approach will therefore not pursue further evaluation of monoclonal gammopathy.  This is reasonable since he is frail elderly.  Hemoglobin improved to 13.1 February 07 2023- Hgb 11.4.  Obtain follow up CBC and ferritin in 6 weeks to assess if need for IV iron.  Hgb has declined since february5  Monoclonal gammopathy, IgG kappa + kappa:   August 08 2022:  SPEP with IEP 1 g/dL IgG kappa monoclonal protein.  Serum free kappa 840 October 12 2022: 24-hour urine showed loss of 556 mg of total protein of which 274 mg consisted of free light chains with a kappa lambda ratio of 81.  Creatinine clearance measured was 27  Decreased mobility:  Secondary to frail elderly status which may be exacerbated by  anemia  Vascular disease:  On ASA and Plavix with likely needs to be continued indefinitely      Cancer Staging  No matching staging information was found for the patient.   No problem-specific Assessment & Plan notes found for this encounter.   No orders of the defined types were placed in this encounter.  33  minutes was spent in patient care.  This included time spent preparing to see the patient (review of tests), counseling and educating the patient/family/caregiver, ordering tests, or procedures; documenting clinical information in the electronic or other health record, independently interpreting results and communicating results to the patient/family/caregiver as well as coordination of care.       All questions were answered. The patient knows to call the clinic with any problems, questions or concerns.  This note was electronically signed.    Loni MuseEverett C Payson Crumby, MD  02/06/2023 8:34 AM

## 2023-04-13 DIAGNOSIS — E538 Deficiency of other specified B group vitamins: Secondary | ICD-10-CM | POA: Diagnosis not present

## 2023-04-13 DIAGNOSIS — K2100 Gastro-esophageal reflux disease with esophagitis, without bleeding: Secondary | ICD-10-CM | POA: Diagnosis not present

## 2023-04-13 DIAGNOSIS — Z139 Encounter for screening, unspecified: Secondary | ICD-10-CM | POA: Diagnosis not present

## 2023-04-13 DIAGNOSIS — E785 Hyperlipidemia, unspecified: Secondary | ICD-10-CM | POA: Diagnosis not present

## 2023-04-13 DIAGNOSIS — E559 Vitamin D deficiency, unspecified: Secondary | ICD-10-CM | POA: Diagnosis not present

## 2023-04-13 DIAGNOSIS — Z1331 Encounter for screening for depression: Secondary | ICD-10-CM | POA: Diagnosis not present

## 2023-04-13 DIAGNOSIS — D509 Iron deficiency anemia, unspecified: Secondary | ICD-10-CM | POA: Diagnosis not present

## 2023-04-13 DIAGNOSIS — Z9181 History of falling: Secondary | ICD-10-CM | POA: Diagnosis not present

## 2023-04-13 DIAGNOSIS — I714 Abdominal aortic aneurysm, without rupture, unspecified: Secondary | ICD-10-CM | POA: Diagnosis not present

## 2023-04-13 DIAGNOSIS — Z6826 Body mass index (BMI) 26.0-26.9, adult: Secondary | ICD-10-CM | POA: Diagnosis not present

## 2023-04-13 DIAGNOSIS — N400 Enlarged prostate without lower urinary tract symptoms: Secondary | ICD-10-CM | POA: Diagnosis not present

## 2023-04-13 DIAGNOSIS — I10 Essential (primary) hypertension: Secondary | ICD-10-CM | POA: Diagnosis not present

## 2023-05-08 ENCOUNTER — Inpatient Hospital Stay: Payer: Medicare PPO | Attending: Oncology | Admitting: Oncology

## 2023-05-08 ENCOUNTER — Encounter: Payer: Self-pay | Admitting: Oncology

## 2023-05-08 ENCOUNTER — Inpatient Hospital Stay: Payer: Medicare PPO

## 2023-05-08 VITALS — BP 124/85 | HR 85 | Temp 98.5°F | Resp 18 | Ht 75.0 in

## 2023-05-08 DIAGNOSIS — N400 Enlarged prostate without lower urinary tract symptoms: Secondary | ICD-10-CM | POA: Insufficient documentation

## 2023-05-08 DIAGNOSIS — M1990 Unspecified osteoarthritis, unspecified site: Secondary | ICD-10-CM | POA: Insufficient documentation

## 2023-05-08 DIAGNOSIS — Z7982 Long term (current) use of aspirin: Secondary | ICD-10-CM | POA: Diagnosis not present

## 2023-05-08 DIAGNOSIS — D472 Monoclonal gammopathy: Secondary | ICD-10-CM

## 2023-05-08 DIAGNOSIS — Z87891 Personal history of nicotine dependence: Secondary | ICD-10-CM | POA: Insufficient documentation

## 2023-05-08 DIAGNOSIS — N183 Chronic kidney disease, stage 3 unspecified: Secondary | ICD-10-CM

## 2023-05-08 DIAGNOSIS — K219 Gastro-esophageal reflux disease without esophagitis: Secondary | ICD-10-CM | POA: Diagnosis not present

## 2023-05-08 DIAGNOSIS — R54 Age-related physical debility: Secondary | ICD-10-CM | POA: Diagnosis not present

## 2023-05-08 DIAGNOSIS — N189 Chronic kidney disease, unspecified: Secondary | ICD-10-CM | POA: Insufficient documentation

## 2023-05-08 DIAGNOSIS — N2889 Other specified disorders of kidney and ureter: Secondary | ICD-10-CM

## 2023-05-08 DIAGNOSIS — I129 Hypertensive chronic kidney disease with stage 1 through stage 4 chronic kidney disease, or unspecified chronic kidney disease: Secondary | ICD-10-CM | POA: Insufficient documentation

## 2023-05-08 DIAGNOSIS — D509 Iron deficiency anemia, unspecified: Secondary | ICD-10-CM | POA: Insufficient documentation

## 2023-05-08 DIAGNOSIS — Z79899 Other long term (current) drug therapy: Secondary | ICD-10-CM | POA: Insufficient documentation

## 2023-05-08 DIAGNOSIS — E785 Hyperlipidemia, unspecified: Secondary | ICD-10-CM | POA: Insufficient documentation

## 2023-05-08 DIAGNOSIS — Z8679 Personal history of other diseases of the circulatory system: Secondary | ICD-10-CM | POA: Insufficient documentation

## 2023-05-08 DIAGNOSIS — I2510 Atherosclerotic heart disease of native coronary artery without angina pectoris: Secondary | ICD-10-CM | POA: Insufficient documentation

## 2023-05-08 DIAGNOSIS — E538 Deficiency of other specified B group vitamins: Secondary | ICD-10-CM | POA: Diagnosis not present

## 2023-05-08 DIAGNOSIS — D539 Nutritional anemia, unspecified: Secondary | ICD-10-CM | POA: Diagnosis not present

## 2023-05-08 DIAGNOSIS — Z7902 Long term (current) use of antithrombotics/antiplatelets: Secondary | ICD-10-CM | POA: Insufficient documentation

## 2023-05-08 LAB — CBC WITH DIFFERENTIAL/PLATELET
Abs Immature Granulocytes: 0.06 10*3/uL (ref 0.00–0.07)
Basophils Absolute: 0 10*3/uL (ref 0.0–0.1)
Basophils Relative: 0 %
Eosinophils Absolute: 0.1 10*3/uL (ref 0.0–0.5)
Eosinophils Relative: 1 %
HCT: 33.8 % — ABNORMAL LOW (ref 39.0–52.0)
Hemoglobin: 11 g/dL — ABNORMAL LOW (ref 13.0–17.0)
Immature Granulocytes: 1 %
Lymphocytes Relative: 5 %
Lymphs Abs: 0.5 10*3/uL — ABNORMAL LOW (ref 0.7–4.0)
MCH: 33.3 pg (ref 26.0–34.0)
MCHC: 32.5 g/dL (ref 30.0–36.0)
MCV: 102.4 fL — ABNORMAL HIGH (ref 80.0–100.0)
Monocytes Absolute: 0.6 10*3/uL (ref 0.1–1.0)
Monocytes Relative: 5 %
Neutro Abs: 10 10*3/uL — ABNORMAL HIGH (ref 1.7–7.7)
Neutrophils Relative %: 88 %
Platelets: 327 10*3/uL (ref 150–400)
RBC: 3.3 MIL/uL — ABNORMAL LOW (ref 4.22–5.81)
RDW: 13.9 % (ref 11.5–15.5)
WBC: 11.3 10*3/uL — ABNORMAL HIGH (ref 4.0–10.5)
nRBC: 0 % (ref 0.0–0.2)

## 2023-05-08 LAB — VITAMIN B12: Vitamin B-12: 1574 pg/mL — ABNORMAL HIGH (ref 180–914)

## 2023-05-08 LAB — RETICULOCYTES
Immature Retic Fract: 14.9 % (ref 2.3–15.9)
RBC.: 3.3 MIL/uL — ABNORMAL LOW (ref 4.22–5.81)
Retic Count, Absolute: 51.5 10*3/uL (ref 19.0–186.0)
Retic Ct Pct: 1.6 % (ref 0.4–3.1)

## 2023-05-08 LAB — FERRITIN
Ferritin: 101 ng/mL (ref 24–336)
Ferritin: 93 ng/mL (ref 24–336)

## 2023-05-08 LAB — FOLATE: Folate: 28.2 ng/mL (ref >5.9)

## 2023-05-08 NOTE — Progress Notes (Signed)
[facility] Cancer Initial Visit:  Patient Care Team: Jim Like, NP as PCP - General (Nurse Practitioner)  CHIEF COMPLAINTS/PURPOSE OF CONSULTATION:    HISTORY OF PRESENTING ILLNESS: Johnny Leblanc 89 y.o. male is here because of  anemia Medical history notable for arthritis, easy bruising (on Plavix and aspirin) Metamucil, coronary artery disease, diverticulosis, GERD, hyperlipidemia, hypertension, abdominal aortic aneurysm B12 deficiency, BPH  June 13, 2022 vitamin B12 1492 June 28, 2022 WBC 7.5 hemoglobin 10.5 MCV 99 platelet count 328; 73 segs 15 lymphs 7 monos 5 eos  August 03, 2022:  WBC 11.0 hemoglobin 10.2 MCV 95 platelet 594; 76 segs 12 lymphs 7 monos 4 eos 1 basophil Ferritin 86  Chemistry is notable for albumin 3.8 creatinine 1.00  August 08 2022:  [facility] Hematology Consult Patient has been noted to have anemia for about 15 yrs per family.   He has taken iron supplements in the past and these have recently been restarted about a week ago.  Has tolerated them in the past.  No history of blood transfusion or IV iron.  Last colonoscopy was about 20 yrs ago.  Does not recall having undergone and EGD.   Ambulates with walker at home.  Here today in a rollator.  Lives with daughter and can do basic ADL's  Social:  widowed.  Worked in 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  Began smoking in high school and quit about 50 yrs ago.  Can't recall how much he smoked.  EtOH none  FMH:   Mother died 9 skin cancer Father died 38 CAD Brother died 64 aneurism  WBC 10.4 hemoglobin 10.4 MCV 99 platelet count 417; 86 segs 6 lymphs 5 monos 3 eos Coombs test negative haptoglobin 283   SPEP with IEP demonstrated an M spike of 1 g/dL of IgG kappa monoclonal protein. serum free kappa 840 lambda 15.8 with a kappa lambda 53.15 IgG 1600 IgA 265 IgM 13 B12 1352 folate 24.5 CMP notable for creatinine 1.57 calcium 8.7 albumin 3.2 total protein 7.8  September 14, 2022 Feraheme 510 mg September 19, 2022 Feraheme 510 mg  October 10 2022:    Reviewed results of labs.  Feels better since receiving IV iron  October 12 2022: 24-hour urine showed loss of 556 mg of total protein of which 274 mg consisted of free light chains with a kappa lambda ratio of 81  November 14 2022:  Bone marrow bx and aspirate attempted but patient too dyspneic to perform  WBC 8.7 hemoglobin 12.1 platelet count 308  November 17, 2022 urine urine immunofixation showed Bence-Jones protein kappa type  December 05 2022:  Reviewed results of labs with patient and daughter.  Patient frustrated at having to go to numerous medical visits.  He would like to keep procedures and visits to a minimum and adopt a more palliative approach.  Feels OK.  Has lost 2 lbs.  ECOG 2 at best  WBC 7.4 hemoglobin 13.1 MCV 103 count 424; 79 segs 12 lymphs 5 monos 3 eos Cr 1.3  Ferritin 179  February 06 2023:   Feels well overall.  Spends much of his time sitting but has been doing a bit more Hgb 11.4 MCV 103  Ferritin 120  May 08 2023:  Scheduled follow up for anemia. Spends most of his time sitting, watching TV.  Walks around the house a bit.  Remains on oral iron Hgb 11.0 MCV 102  Retic 1.6 Ferritin 101  Folate 28.2  Review of Systems  Constitutional:  Negative for chills and fever.  HENT:   Positive for hearing loss. Negative for mouth sores and trouble swallowing.   Respiratory:  Positive for cough. Negative for hemoptysis and shortness of breath.        Slight cough productive of clear mucus.    Cardiovascular:  Positive for leg swelling. Negative for chest pain and palpitations.  Gastrointestinal:  Positive for diarrhea. Negative for abdominal pain, blood in stool, constipation, nausea and vomiting.       Stools dark and greenish on oral iron.  Occasional diarrhea  Genitourinary:  Negative for dysuria, frequency and hematuria.   Musculoskeletal:  Negative for arthralgias, back pain and myalgias.   Neurological:  Positive for extremity weakness. Negative for seizures.       No falls.  Uses walker at home  Hematological:  Negative for adenopathy.       Has easy bruising but no bleeding    MEDICAL HISTORY: Past Medical History:  Diagnosis Date   Arthritis    Bruises easily    pt takes Plavix and office told them to continue his plavix and asa   Constipation    takes Metamucil daily   Coronary artery disease    Diverticulosis    GERD (gastroesophageal reflux disease)    takes Omeprazole daily   H/O hiatal hernia    Hyperlipidemia    takes Lopid daily   Hypertension    takes Maxzide,Procardia,and Metoprolol daily   Nocturia    Stented coronary artery    Urinary frequency     SURGICAL HISTORY: Past Surgical History:  Procedure Laterality Date   ABDOMINAL AORTAGRAM N/A 03/12/2012   Procedure: ABDOMINAL Ronny Flurry;  Surgeon: Fransisco Hertz, MD;  Location: [facility] CATH LAB;  Service: Cardiovascular;  Laterality: N/A;   cataract surgery     bilateral   COLONOSCOPY     CORONARY ANGIOPLASTY  90's/2003/2013   4    SOCIAL HISTORY: Social History   Socioeconomic History   Marital status: Widowed    Spouse name: Not on file   Number of children: 2   Years of education: 47   Highest education level: High school graduate  Occupational History   Not on file  Tobacco Use   Smoking status: Former    Types: Cigarettes    Quit date: 05/22/1981    Years since quitting: 41.9   Smokeless tobacco: Never   Tobacco comments:    quit 30+yrs ago  Vaping Use   Vaping Use: Never used  Substance and Sexual Activity   Alcohol use: No   Drug use: No   Sexual activity: Never  Other Topics Concern   Not on file  Social History Narrative   Not on file   Social Determinants of Health   Financial Resource Strain: Not on file  Food Insecurity: Not on file  Transportation Needs: Not on file  Physical Activity: Not on file  Stress: Not on file  Social Connections: Not on file   Intimate Partner Violence: Not on file    FAMILY HISTORY Family History  Problem Relation Age of Onset   Aneurysm Father    Heart attack Father    Aneurysm Brother    Diabetes Brother    Cancer Mother     ALLERGIES:  is allergic to protonix [pantoprazole].  MEDICATIONS:  Current Outpatient Medications  Medication Sig Dispense Refill   aspirin 325 MG EC tablet Take 325 mg by mouth daily.  brimonidine (ALPHAGAN) 0.2 % ophthalmic solution 2 (two) times daily.     calcium-vitamin D (OSCAL WITH D) 500-5 MG-MCG tablet Take 1 tablet by mouth.     clopidogrel (PLAVIX) 75 MG tablet Take 75 mg by mouth daily.     colesevelam (WELCHOL) 625 MG tablet Take 2,500 mg by mouth 2 (two) times daily with a meal.     ezetimibe (ZETIA) 10 MG tablet Take 10 mg by mouth daily.     Fe Fum-FA-B Cmp-C-Zn-Mg-Mn-Cu (CENTRATEX) 106-1 MG CAPS Take 1 tablet by mouth daily.     ferrous sulfate 324 MG TBEC Take 324 mg by mouth daily with breakfast.     gemfibrozil (LOPID) 600 MG tablet Take 600 mg by mouth 2 (two) times daily before a meal.     Methylcobalamin (B12-ACTIVE) 1 MG CHEW Chew by mouth.     metoprolol succinate (TOPROL-XL) 100 MG 24 hr tablet Take 100 mg by mouth daily.     NIFEdipine (PROCARDIA XL/ADALAT-CC) 60 MG 24 hr tablet Take 60 mg by mouth daily.     nitroGLYCERIN (NITROSTAT) 0.4 MG SL tablet Place 0.4 mg under the tongue every 5 (five) minutes as needed. As needed for chest pain.     omeprazole (PRILOSEC) 20 MG capsule Take 20 mg by mouth daily.     prednisoLONE acetate (PRED FORTE) 1 % ophthalmic suspension daily.     psyllium (HYDROCIL/METAMUCIL) 95 % PACK Take 1 packet by mouth daily.     tamsulosin (FLOMAX) 0.4 MG CAPS capsule Take 0.4 mg by mouth at bedtime.     triamterene-hydrochlorothiazide (MAXZIDE-25) 37.5-25 MG per tablet Take 1 tablet by mouth daily.     Vitamin D, Ergocalciferol, (DRISDOL) 1.25 MG (50000 UNIT) CAPS capsule Take 50,000 Units by mouth every 7 (seven) days.      No current facility-administered medications for this visit.    PHYSICAL EXAMINATION:  ECOG PERFORMANCE STATUS: 3 - Symptomatic, >50% confined to bed   There were no vitals filed for this visit.   There were no vitals filed for this visit.    Physical Exam Vitals and nursing note reviewed.  Constitutional:      General: He is not in acute distress.    Appearance: Normal appearance. He is not toxic-appearing or diaphoretic.     Comments: Here with daughter.  Seated in rollator  HENT:     Head: Normocephalic and atraumatic.     Right Ear: External ear normal.     Left Ear: External ear normal.     Nose: Nose normal.  Eyes:     General: No scleral icterus.    Conjunctiva/sclera: Conjunctivae normal.     Pupils: Pupils are equal, round, and reactive to light.  Cardiovascular:     Rate and Rhythm: Normal rate and regular rhythm.     Heart sounds: Normal heart sounds. No murmur heard.    No friction rub. No gallop.  Pulmonary:     Effort: Pulmonary effort is normal. No respiratory distress.     Breath sounds: Normal breath sounds. No stridor. No wheezing, rhonchi or rales.  Chest:     Chest wall: No tenderness.  Abdominal:     General: Bowel sounds are normal. There is no distension.     Palpations: Abdomen is soft. There is no mass.     Tenderness: There is no abdominal tenderness. There is no guarding or rebound.  Musculoskeletal:        General: No signs of injury.  Cervical back: Normal range of motion and neck supple. No rigidity or tenderness.     Right lower leg: No edema.     Left lower leg: No edema.     Comments: Examined seated in rollator  Lymphadenopathy:     Head:     Right side of head: No submental, submandibular, tonsillar, preauricular, posterior auricular or occipital adenopathy.     Left side of head: No submental, submandibular, tonsillar, preauricular, posterior auricular or occipital adenopathy.     Cervical: No cervical adenopathy.      Right cervical: No superficial, deep or posterior cervical adenopathy.    Left cervical: No superficial, deep or posterior cervical adenopathy.     Upper Body:     Right upper body: No supraclavicular or axillary adenopathy.     Left upper body: No supraclavicular or axillary adenopathy.     Lower Body: No right inguinal adenopathy. No left inguinal adenopathy.  Skin:    Coloration: Skin is not jaundiced.  Neurological:     General: No focal deficit present.     Mental Status: He is alert and oriented to person, place, and time. Mental status is at baseline.     Motor: Weakness present.     Comments: Hard of hearing.  Gait not assessed as patient is in a rollator  Psychiatric:        Mood and Affect: Mood normal.        Behavior: Behavior normal.        Thought Content: Thought content normal.        Judgment: Judgment normal.     LABORATORY DATA: I have personally reviewed the data as listed:  No visits with results within 1 Month(s) from this visit.  Latest known visit with results is:  Appointment on 02/06/2023  Component Date Value Ref Range Status   Ferritin 02/06/2023 126  24 - 336 ng/mL Final   Performed at [facility], [address], [place], [place] [address]    RADIOGRAPHIC STUDIES: I have personally reviewed the radiological images as listed and agree with the findings in the report  No results found.  ASSESSMENT/PLAN  89 y.o. male with medical history notable for arthritis, easy bruising (on Plavix and aspirin)  coronary artery disease, diverticulosis, GERD, hyperlipidemia, hypertension, abdominal aortic aneurysm B12 deficiency, BPH who has a long history of anemia  Anemia:  Main component is iron deficiency due to chronic GI blood loss exacerbated by antiplatelet therapy.  CKD another major component.  Testosterone level normal.  Normal haptoglobin indicates hemolysis is unlikely.    November 10 2022:  PSA 1.18  Therapeutics:  Received IV  iron without complication  September 14, 2022 Feraheme 510 mg September 19, 2022 Feraheme 510 mg November 14 2022:  Hgb 12.1 indicating a major response to IV iron December 05 2022:  Patient and daughter wish to pursue a palliative approach will therefore not pursue further evaluation of monoclonal gammopathy.  This is reasonable since he is frail elderly.  Hemoglobin improved to 13.1 February 07 2023- Hgb 11.4.  Hgb has declined since february5 May 08 2023 - Hgb 11.0 MCV 102  Retic 1.6 Ferritin 101 Folate 28.2  Hgb continues to steadily decline despite oral iron.  Iron stores appear adequate.  Does not qualify for ESA.  I suspect the steady decline is due to CKD  Monoclonal gammopathy, IgG kappa + kappa:   August 08 2022:  SPEP with IEP 1 g/dL IgG kappa monoclonal protein.  Serum free kappa 840 October 12 2022: 24-hour urine showed loss of 556 mg of total protein of which 274 mg consisted of free light chains with a kappa lambda ratio of 81.  Creatinine clearance measured was 27 May 08 2023- Will check SPEP with IEP, free light chains  Decreased mobility:  Secondary to frail elderly status which may be exacerbated by anemia  Vascular disease:  On ASA and Plavix with likely needs to be continued indefinitely      Cancer Staging  No matching staging information was found for the patient.   No problem-specific Assessment & Plan notes found for this encounter.   No orders of the defined types were placed in this encounter.  30  minutes was spent in patient care.  This included time spent preparing to see the patient (review of tests), counseling and educating the patient/family/caregiver, ordering tests, or procedures; documenting clinical information in the electronic or other health record, independently interpreting results and communicating results to the patient/family/caregiver as well as coordination of care.       All questions were answered. The patient knows to call the clinic with any  problems, questions or concerns.  This note was electronically signed.    Loni Muse, MD  05/08/2023 8:44 AM

## 2023-05-09 LAB — HAPTOGLOBIN: Haptoglobin: 309 mg/dL (ref 38–329)

## 2023-05-09 LAB — KAPPA/LAMBDA LIGHT CHAINS
Kappa free light chain: 837.3 mg/L — ABNORMAL HIGH (ref 3.3–19.4)
Kappa, lambda light chain ratio: 60.67 — ABNORMAL HIGH (ref 0.26–1.65)
Lambda free light chains: 13.8 mg/L (ref 5.7–26.3)

## 2023-05-10 LAB — COPPER, SERUM: Copper: 102 ug/dL (ref 69–132)

## 2023-05-10 LAB — ZINC: Zinc: 48 ug/dL (ref 44–115)

## 2023-05-11 LAB — MULTIPLE MYELOMA PANEL, SERUM
Albumin SerPl Elph-Mcnc: 3.2 g/dL (ref 2.9–4.4)
Albumin/Glob SerPl: 1 (ref 0.7–1.7)
Alpha 1: 0.4 g/dL (ref 0.0–0.4)
Alpha2 Glob SerPl Elph-Mcnc: 1 g/dL (ref 0.4–1.0)
B-Globulin SerPl Elph-Mcnc: 0.9 g/dL (ref 0.7–1.3)
Gamma Glob SerPl Elph-Mcnc: 1.2 g/dL (ref 0.4–1.8)
Globulin, Total: 3.5 g/dL (ref 2.2–3.9)
IgA: 152 mg/dL (ref 61–437)
IgG (Immunoglobin G), Serum: 1369 mg/dL (ref 603–1613)
IgM (Immunoglobulin M), Srm: <5 mg/dL — ABNORMAL LOW (ref 15–143)
M Protein SerPl Elph-Mcnc: 0.8 g/dL — ABNORMAL HIGH
Total Protein ELP: 6.7 g/dL (ref 6.0–8.5)

## 2023-05-16 ENCOUNTER — Encounter: Payer: Self-pay | Admitting: Oncology

## 2023-06-08 ENCOUNTER — Inpatient Hospital Stay: Payer: Medicare PPO

## 2023-06-08 ENCOUNTER — Inpatient Hospital Stay: Payer: Medicare PPO | Attending: Oncology | Admitting: Oncology

## 2023-06-08 VITALS — BP 136/62 | HR 82 | Temp 97.6°F | Resp 20 | Ht 75.0 in | Wt 203.6 lb

## 2023-06-08 DIAGNOSIS — D472 Monoclonal gammopathy: Secondary | ICD-10-CM

## 2023-06-08 DIAGNOSIS — R54 Age-related physical debility: Secondary | ICD-10-CM | POA: Diagnosis not present

## 2023-06-08 DIAGNOSIS — K922 Gastrointestinal hemorrhage, unspecified: Secondary | ICD-10-CM | POA: Diagnosis not present

## 2023-06-08 DIAGNOSIS — D539 Nutritional anemia, unspecified: Secondary | ICD-10-CM | POA: Diagnosis not present

## 2023-06-08 DIAGNOSIS — D509 Iron deficiency anemia, unspecified: Secondary | ICD-10-CM | POA: Diagnosis present

## 2023-06-08 DIAGNOSIS — D804 Selective deficiency of immunoglobulin M [IgM]: Secondary | ICD-10-CM | POA: Diagnosis not present

## 2023-06-08 DIAGNOSIS — E538 Deficiency of other specified B group vitamins: Secondary | ICD-10-CM | POA: Diagnosis not present

## 2023-06-08 DIAGNOSIS — D500 Iron deficiency anemia secondary to blood loss (chronic): Secondary | ICD-10-CM | POA: Diagnosis not present

## 2023-06-08 NOTE — Progress Notes (Signed)
[facility] Cancer Initial Visit:  Patient Care Team: Jim Like, NP as PCP - General (Nurse Practitioner)  CHIEF COMPLAINTS/PURPOSE OF CONSULTATION:    HISTORY OF PRESENTING ILLNESS: Johnny Leblanc 89 y.o. male is here because of  anemia Medical history notable for arthritis, easy bruising (on Plavix and aspirin) Metamucil, coronary artery disease, diverticulosis, GERD, hyperlipidemia, hypertension, abdominal aortic aneurysm B12 deficiency, BPH  June 13, 2022 vitamin B12 1492 June 28, 2022 WBC 7.5 hemoglobin 10.5 MCV 99 platelet count 328; 73 segs 15 lymphs 7 monos 5 eos  August 03, 2022:  WBC 11.0 hemoglobin 10.2 MCV 95 platelet 594; 76 segs 12 lymphs 7 monos 4 eos 1 basophil Ferritin 86  Chemistry is notable for albumin 3.8 creatinine 1.00  August 08 2022:  [facility] Hematology Consult Patient has been noted to have anemia for about 15 yrs per family.   He has taken iron supplements in the past and these have recently been restarted about a week ago.  Has tolerated them in the past.  No history of blood transfusion or IV iron.  Last colonoscopy was about 20 yrs ago.  Does not recall having undergone and EGD.   Ambulates with walker at home.  Here today in a rollator.  Lives with daughter and can do basic ADL's  Social:  widowed.  Worked in 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  Began smoking in high school and quit about 50 yrs ago.  Can't recall how much he smoked.  EtOH none  FMH:   Mother died 84 skin cancer Father died 75 CAD Brother died 22 aneurism  WBC 10.4 hemoglobin 10.4 MCV 99 platelet count 417; 86 segs 6 lymphs 5 monos 3 eos Coombs test negative haptoglobin 283   SPEP with IEP demonstrated an M spike of 1 g/dL of IgG kappa monoclonal protein. serum free kappa 840 lambda 15.8 with a kappa lambda 53.15 IgG 1600 IgA 265 IgM 13 B12 1352 folate 24.5 CMP notable for creatinine 1.57 calcium 8.7 albumin 3.2 total protein 7.8  September 14, 2022 Feraheme 510 mg September 19, 2022 Feraheme 510 mg  October 10 2022:    Reviewed results of labs.  Feels better since receiving IV iron  October 12 2022: 24-hour urine showed loss of 556 mg of total protein of which 274 mg consisted of free light chains with a kappa lambda ratio of 81  November 14 2022:  Bone marrow bx and aspirate attempted but patient too dyspneic to perform  WBC 8.7 hemoglobin 12.1 platelet count 308  November 17, 2022 urine urine immunofixation showed Bence-Jones protein kappa type  December 05 2022:  Reviewed results of labs with patient and daughter.  Patient frustrated at having to go to numerous medical visits.  He would like to keep procedures and visits to a minimum and adopt a more palliative approach.  Feels OK.  Has lost 2 lbs.  ECOG 2 at best  WBC 7.4 hemoglobin 13.1 MCV 103 count 424; 79 segs 12 lymphs 5 monos 3 eos Cr 1.3  Ferritin 179  February 06 2023:   Feels well overall.  Spends much of his time sitting but has been doing a bit more Hgb 11.4 MCV 103  Ferritin 120  May 08 2023:   Spends most of his time sitting, watching TV.  Walks around the house a bit.  Remains on oral iron Hgb 11.0 MCV 102  Retic 1.6 Ferritin 101 Folate 28.2 B12 1574  copper 102 zinc 48 SPEP with IEP demonstrated 0.8 g/dL IgG kappa monoclonal protein.  Serum free kappa 837 lambda 13.8 with a kappa lambda 60.67 IgG 1369 IgA 152 IgM undetectable  June 08 2023:  Scheduled follow up for anemia.  Discussed various modes of testing vs observation.  Can not tolerate lying flat or on his side for bone marrow test.  Can sit in a recliner therefore can consider sternal aspirate.  24 hr urine collection difficult because of memory issues.   Will get spot urine for UPEP with IFE  Review of Systems  Constitutional:  Negative for chills and fever.  HENT:   Positive for hearing loss. Negative for mouth sores and trouble swallowing.   Respiratory:  Positive for cough. Negative for hemoptysis  and shortness of breath.        Slight cough productive of clear mucus.    Cardiovascular:  Positive for leg swelling. Negative for chest pain and palpitations.  Gastrointestinal:  Positive for diarrhea. Negative for abdominal pain, blood in stool, constipation, nausea and vomiting.       Stools dark and greenish on oral iron.  Occasional diarrhea  Genitourinary:  Negative for dysuria, frequency and hematuria.   Musculoskeletal:  Negative for arthralgias, back pain and myalgias.  Neurological:  Positive for extremity weakness. Negative for seizures.       No falls.  Uses walker at home  Hematological:  Negative for adenopathy.       Has easy bruising but no bleeding    MEDICAL HISTORY: Past Medical History:  Diagnosis Date   Arthritis    Bruises easily    pt takes Plavix and office told them to continue his plavix and asa   Constipation    takes Metamucil daily   Coronary artery disease    Diverticulosis    GERD (gastroesophageal reflux disease)    takes Omeprazole daily   H/O hiatal hernia    Hyperlipidemia    takes Lopid daily   Hypertension    takes Maxzide,Procardia,and Metoprolol daily   Nocturia    Stented coronary artery    Urinary frequency     SURGICAL HISTORY: Past Surgical History:  Procedure Laterality Date   ABDOMINAL AORTAGRAM N/A 03/12/2012   Procedure: ABDOMINAL Ronny Flurry;  Surgeon: Fransisco Hertz, MD;  Location: [facility] CATH LAB;  Service: Cardiovascular;  Laterality: N/A;   cataract surgery     bilateral   COLONOSCOPY     CORONARY ANGIOPLASTY  90's/2003/2013   4    SOCIAL HISTORY: Social History   Socioeconomic History   Marital status: Widowed    Spouse name: Not on file   Number of children: 2   Years of education: 42   Highest education level: High school graduate  Occupational History   Not on file  Tobacco Use   Smoking status: Former    Current packs/day: 0.00    Types: Cigarettes    Quit date: 05/22/1981    Years since quitting: 42.0    Smokeless tobacco: Never   Tobacco comments:    quit 30+yrs ago  Vaping Use   Vaping status: Never Used  Substance and Sexual Activity   Alcohol use: No   Drug use: No   Sexual activity: Never  Other Topics Concern   Not on file  Social History Narrative   Not on file   Social Determinants of Health   Financial Resource Strain: Not on file  Food Insecurity: Not on file  Transportation Needs: Not on  file  Physical Activity: Not on file  Stress: Not on file  Social Connections: Not on file  Intimate Partner Violence: Not on file    FAMILY HISTORY Family History  Problem Relation Age of Onset   Aneurysm Father    Heart attack Father    Aneurysm Brother    Diabetes Brother    Cancer Mother     ALLERGIES:  is allergic to protonix [pantoprazole].  MEDICATIONS:  Current Outpatient Medications  Medication Sig Dispense Refill   aspirin 325 MG EC tablet Take 325 mg by mouth daily.     brimonidine (ALPHAGAN) 0.2 % ophthalmic solution 2 (two) times daily.     calcium-vitamin D (OSCAL WITH D) 500-5 MG-MCG tablet Take 1 tablet by mouth.     clopidogrel (PLAVIX) 75 MG tablet Take 75 mg by mouth daily.     colesevelam (WELCHOL) 625 MG tablet Take 2,500 mg by mouth 2 (two) times daily with a meal.     ezetimibe (ZETIA) 10 MG tablet Take 10 mg by mouth daily.     Fe Fum-FA-B Cmp-C-Zn-Mg-Mn-Cu (CENTRATEX) 106-1 MG CAPS Take 1 tablet by mouth daily.     ferrous sulfate 324 MG TBEC Take 324 mg by mouth daily with breakfast.     gemfibrozil (LOPID) 600 MG tablet Take 600 mg by mouth 2 (two) times daily before a meal.     Methylcobalamin (B12-ACTIVE) 1 MG CHEW Chew by mouth.     metoprolol succinate (TOPROL-XL) 100 MG 24 hr tablet Take 100 mg by mouth daily.     NIFEdipine (PROCARDIA XL/ADALAT-CC) 60 MG 24 hr tablet Take 60 mg by mouth daily.     nitroGLYCERIN (NITROSTAT) 0.4 MG SL tablet Place 0.4 mg under the tongue every 5 (five) minutes as needed. As needed for chest pain.      omeprazole (PRILOSEC) 20 MG capsule Take 20 mg by mouth daily.     prednisoLONE acetate (PRED FORTE) 1 % ophthalmic suspension daily.     psyllium (HYDROCIL/METAMUCIL) 95 % PACK Take 1 packet by mouth daily.     tamsulosin (FLOMAX) 0.4 MG CAPS capsule Take 0.4 mg by mouth at bedtime.     triamterene-hydrochlorothiazide (MAXZIDE-25) 37.5-25 MG per tablet Take 1 tablet by mouth daily.     Vitamin D, Ergocalciferol, (DRISDOL) 1.25 MG (50000 UNIT) CAPS capsule Take 50,000 Units by mouth every 7 (seven) days.     No current facility-administered medications for this visit.    PHYSICAL EXAMINATION:  ECOG PERFORMANCE STATUS: 3 - Symptomatic, >50% confined to bed   There were no vitals filed for this visit.   There were no vitals filed for this visit.    Physical Exam Vitals and nursing note reviewed.  Constitutional:      General: He is not in acute distress.    Appearance: Normal appearance. He is not toxic-appearing or diaphoretic.     Comments: Here with daughter.  Seated in rollator  HENT:     Head: Normocephalic and atraumatic.     Right Ear: External ear normal.     Left Ear: External ear normal.     Nose: Nose normal.  Eyes:     General: No scleral icterus.    Conjunctiva/sclera: Conjunctivae normal.     Pupils: Pupils are equal, round, and reactive to light.  Cardiovascular:     Rate and Rhythm: Normal rate and regular rhythm.     Heart sounds: Normal heart sounds. No murmur heard.    No friction  rub. No gallop.  Pulmonary:     Effort: Pulmonary effort is normal. No respiratory distress.     Breath sounds: Normal breath sounds. No stridor. No wheezing, rhonchi or rales.  Chest:     Chest wall: No tenderness.  Abdominal:     General: Bowel sounds are normal. There is no distension.     Palpations: Abdomen is soft. There is no mass.     Tenderness: There is no abdominal tenderness. There is no guarding or rebound.  Musculoskeletal:        General: No signs of injury.      Cervical back: Normal range of motion and neck supple. No rigidity or tenderness.     Right lower leg: No edema.     Left lower leg: No edema.     Comments: Examined seated in rollator  Lymphadenopathy:     Head:     Right side of head: No submental, submandibular, tonsillar, preauricular, posterior auricular or occipital adenopathy.     Left side of head: No submental, submandibular, tonsillar, preauricular, posterior auricular or occipital adenopathy.     Cervical: No cervical adenopathy.     Right cervical: No superficial, deep or posterior cervical adenopathy.    Left cervical: No superficial, deep or posterior cervical adenopathy.     Upper Body:     Right upper body: No supraclavicular or axillary adenopathy.     Left upper body: No supraclavicular or axillary adenopathy.     Lower Body: No right inguinal adenopathy. No left inguinal adenopathy.  Skin:    Coloration: Skin is not jaundiced.  Neurological:     General: No focal deficit present.     Mental Status: He is alert and oriented to person, place, and time. Mental status is at baseline.     Motor: Weakness present.     Comments: Hard of hearing.  Gait not assessed as patient is in a rollator  Psychiatric:        Mood and Affect: Mood normal.        Behavior: Behavior normal.        Thought Content: Thought content normal.        Judgment: Judgment normal.     LABORATORY DATA: I have personally reviewed the data as listed:  No visits with results within 1 Month(s) from this visit.  Latest known visit with results is:  Appointment on 05/08/2023  Component Date Value Ref Range Status   Haptoglobin 05/08/2023 309  38 - 329 mg/dL Final   Comment: (NOTE) Performed At: [facility] [address] [place], [place] [id number] Jolene Schimke MD [id number]:[id number]    Retic Ct Pct 05/08/2023 1.6  0.4 - 3.1 % Final   RBC. 05/08/2023 3.30 (L)  4.22 - 5.81 MIL/uL Final   Retic Count, Absolute 05/08/2023 51.5  19.0 -  186.0 K/uL Final   Immature Retic Fract 05/08/2023 14.9  2.3 - 15.9 % Final   Performed at [facility], [address] [place], [place] [address]   Ferritin 05/08/2023 93  24 - 336 ng/mL Final   Performed at [facility], [address] [place], [place] [address]   Zinc 05/08/2023 48  44 - 115 ug/dL Final   Comment: (NOTE) This test was developed and its performance characteristics determined by [facility]. It has not been cleared or approved by the Food and Drug Administration.  Detection Limit = 5 Performed At: [facility] [address] [place], [place] [id number] Jolene Schimke MD [id number]    Kappa free light chain 05/08/2023 837.3 (H)  3.3 - 19.4 mg/L Final   Lambda free light chains 05/08/2023 13.8  5.7 - 26.3 mg/L Final   Kappa, lambda light chain ratio 05/08/2023 60.67 (H)  0.26 - 1.65 Final   Comment: (NOTE) Performed At: [facility] [address] [place], [place] [id number] Jolene Schimke MD [id number]:[id number]    IgG (Immunoglobin G), Serum 05/08/2023 1,369  603 - 1,613 mg/dL Final   IgA 03/47/4259 152  61 - 437 mg/dL Final   IgM (Immunoglobulin M), Srm 05/08/2023 <5 (L)  15 - 143 mg/dL Final   Result confirmed on concentration.   Total Protein ELP 05/08/2023 6.7  6.0 - 8.5 g/dL Corrected   Albumin SerPl Elph-Mcnc 05/08/2023 3.2  2.9 - 4.4 g/dL Corrected   Alpha 1 56/38/7564 0.4  0.0 - 0.4 g/dL Corrected   Alpha2 Glob SerPl Elph-Mcnc 05/08/2023 1.0  0.4 - 1.0 g/dL Corrected   B-Globulin SerPl Elph-Mcnc 05/08/2023 0.9  0.7 - 1.3 g/dL Corrected   Gamma Glob SerPl Elph-Mcnc 05/08/2023 1.2  0.4 - 1.8 g/dL Corrected   M Protein SerPl Elph-Mcnc 05/08/2023 0.8 (H)  Not Observed g/dL Corrected   Globulin, Total 05/08/2023 3.5  2.2 - 3.9 g/dL Corrected   Albumin/Glob SerPl 05/08/2023 1.0  0.7 - 1.7 Corrected   IFE 1 05/08/2023 Comment (A)   Corrected   Comment: (NOTE) Immunofixation shows IgG  monoclonal protein with kappa light chain specificity. PLEASE NOTE: Samples from patients receiving DARZALEX(R) (daratumumab) or SARCLISA(R)(isatuximab-irfc) treatment can appear as an "IgG kappa" and mask a complete response (CR). If this patient is receiving these therapies, this IFE assay interference can be removed by ordering test number 123218-"Immunofixation, Daratumumab-Specific, Serum" or [id number]-"Immunofixation, Isatuximab-Specific, Serum" and submitting a new sample for testing or by calling the lab to add this test to the current sample. Immunofixation shows IgA monoclonal protein with kappa light chain specificity.    Please Note 05/08/2023 Comment   Corrected   Comment: (NOTE) Protein electrophoresis scan will follow via computer, mail, or courier delivery. Performed At: [facility] [address] [place], [place] [id number] Jolene Schimke MD [id number]    Copper 05/08/2023 102  69 - 132 ug/dL Final   Comment: (NOTE) This test was developed and its performance characteristics determined by [facility]. It has not been cleared or approved by the Food and Drug Administration.                                Detection Limit = 5 Performed At: [facility] [address] [place], [place] [id number] Jolene Schimke MD [id number]    Ferritin 05/08/2023 101  24 - 336 ng/mL Final   Performed at [facility], [address] [place], [place] [address]   Folate 05/08/2023 28.2  >5.9 ng/mL Final   Comment: RESULT CONFIRMED BY MANUAL DILUTION Performed at [facility], [address] [place], [place] [address]    Vitamin B-12 05/08/2023 1,574 (H)  180 - 914 pg/mL Final   Comment: RESULT CONFIRMED BY MANUAL DILUTION (NOTE) This assay is not validated for testing neonatal or myeloproliferative syndrome specimens for Vitamin B12 levels. Performed at [facility], [address], [place], [place]  [address]     RADIOGRAPHIC STUDIES: I have personally reviewed the  radiological images as listed and agree with the findings in the report  No results found.  ASSESSMENT/PLAN  89 y.o. male with medical history notable for arthritis, easy bruising (on Plavix and aspirin)  coronary artery disease, diverticulosis, GERD, hyperlipidemia, hypertension, abdominal aortic aneurysm B12 deficiency, BPH who has a long history of anemia  Anemia:  Main component is iron deficiency due to chronic GI blood loss exacerbated by antiplatelet therapy.  CKD another major component.  Testosterone level normal.  Normal haptoglobin indicates hemolysis is unlikely.    November 10 2022:  PSA 1.18  Therapeutics:  Received IV iron without complication  September 14, 2022 Feraheme 510 mg September 19, 2022 Feraheme 510 mg November 14 2022:  Hgb 12.1 indicating a major response to IV iron December 05 2022:  Patient and daughter wish to pursue a palliative approach will therefore not pursue further evaluation of monoclonal gammopathy.  This is reasonable since he is frail elderly.  Hemoglobin improved to 13.1 February 07 2023- Hgb 11.4.  Hgb has declined since february5 May 08 2023 - Hgb 11.0 MCV 102  Retic 1.6 Ferritin 101 Folate 28.2  Hgb continues to steadily decline despite oral iron.  Iron stores appear adequate.  Does not qualify for ESA.  I suspect the steady decline is due to CKD June 08 2023:  Discussed consideration of bone marrow exam (sternal aspirate) to evaluate degradation in free light chain ratio, and suppression of non-paraprotein immunoglobulins.  Patient and daughter have opted for watchful waiting.      Monoclonal gammopathy, IgG kappa + kappa:   August 08 2022:  SPEP with IEP 1 g/dL IgG kappa monoclonal protein.  Serum free kappa 840 October 12 2022: 24-hour urine showed loss of 556 mg of total protein of which 274 mg consisted of free light chains with a kappa lambda ratio of 81.  Creatinine clearance  measured was 27 May 08 2023- SPEP with IEP demonstrated 0.8 g/dL IgG kappa monoclonal protein.  Serum free kappa 837 lambda 13.8 with a kappa lambda 60.67 IgG 1369 IgA 152 IgM undetectable    Decreased mobility:  Secondary to frail elderly status which may be exacerbated by anemia  Vascular disease:  On ASA and Plavix with likely needs to be continued indefinitely      Cancer Staging  No matching staging information was found for the patient.    No problem-specific Assessment & Plan notes found for this encounter.   No orders of the defined types were placed in this encounter.  30  minutes was spent in patient care.  This included time spent preparing to see the patient (review of tests), counseling and educating the patient/family/caregiver, ordering tests, or procedures; documenting clinical information in the electronic or other health record, independently interpreting results and communicating results to the patient/family/caregiver as well as coordination of care.       All questions were answered. The patient knows to call the clinic with any problems, questions or concerns.  This note was electronically signed.    Loni Muse, MD  06/08/2023 8:35 AM

## 2023-06-12 ENCOUNTER — Encounter: Payer: Self-pay | Admitting: Oncology

## 2023-06-12 DIAGNOSIS — D804 Selective deficiency of immunoglobulin M [IgM]: Secondary | ICD-10-CM | POA: Insufficient documentation

## 2023-06-16 LAB — MISC LABCORP TEST (SEND OUT)

## 2023-07-19 DIAGNOSIS — K2100 Gastro-esophageal reflux disease with esophagitis, without bleeding: Secondary | ICD-10-CM | POA: Diagnosis not present

## 2023-07-19 DIAGNOSIS — I10 Essential (primary) hypertension: Secondary | ICD-10-CM | POA: Diagnosis not present

## 2023-07-19 DIAGNOSIS — E785 Hyperlipidemia, unspecified: Secondary | ICD-10-CM | POA: Diagnosis not present

## 2023-07-19 DIAGNOSIS — N400 Enlarged prostate without lower urinary tract symptoms: Secondary | ICD-10-CM | POA: Diagnosis not present

## 2023-07-19 DIAGNOSIS — E538 Deficiency of other specified B group vitamins: Secondary | ICD-10-CM | POA: Diagnosis not present

## 2023-07-19 DIAGNOSIS — E559 Vitamin D deficiency, unspecified: Secondary | ICD-10-CM | POA: Diagnosis not present

## 2023-07-19 DIAGNOSIS — D509 Iron deficiency anemia, unspecified: Secondary | ICD-10-CM | POA: Diagnosis not present

## 2023-07-19 DIAGNOSIS — Z125 Encounter for screening for malignant neoplasm of prostate: Secondary | ICD-10-CM | POA: Diagnosis not present

## 2023-08-08 ENCOUNTER — Inpatient Hospital Stay: Payer: Medicare PPO | Attending: Oncology

## 2023-08-08 ENCOUNTER — Other Ambulatory Visit: Payer: Self-pay

## 2023-08-08 DIAGNOSIS — D509 Iron deficiency anemia, unspecified: Secondary | ICD-10-CM | POA: Insufficient documentation

## 2023-08-08 DIAGNOSIS — D472 Monoclonal gammopathy: Secondary | ICD-10-CM

## 2023-08-08 DIAGNOSIS — Z7982 Long term (current) use of aspirin: Secondary | ICD-10-CM | POA: Insufficient documentation

## 2023-08-08 DIAGNOSIS — Z7902 Long term (current) use of antithrombotics/antiplatelets: Secondary | ICD-10-CM

## 2023-08-08 DIAGNOSIS — Z87891 Personal history of nicotine dependence: Secondary | ICD-10-CM | POA: Insufficient documentation

## 2023-08-08 DIAGNOSIS — D539 Nutritional anemia, unspecified: Secondary | ICD-10-CM

## 2023-08-08 DIAGNOSIS — Z79899 Other long term (current) drug therapy: Secondary | ICD-10-CM | POA: Insufficient documentation

## 2023-08-08 DIAGNOSIS — E538 Deficiency of other specified B group vitamins: Secondary | ICD-10-CM | POA: Insufficient documentation

## 2023-08-08 LAB — RETICULOCYTES
Immature Retic Fract: 8 % (ref 2.3–15.9)
RBC.: 3.38 MIL/uL — ABNORMAL LOW (ref 4.22–5.81)
Retic Count, Absolute: 48 10*3/uL (ref 19.0–186.0)
Retic Ct Pct: 1.4 % (ref 0.4–3.1)

## 2023-08-08 LAB — CBC WITH DIFFERENTIAL (CANCER CENTER ONLY)
Abs Immature Granulocytes: 0.01 10*3/uL (ref 0.00–0.07)
Basophils Absolute: 0 10*3/uL (ref 0.0–0.1)
Basophils Relative: 0 %
Eosinophils Absolute: 0.2 10*3/uL (ref 0.0–0.5)
Eosinophils Relative: 2 %
HCT: 35.2 % — ABNORMAL LOW (ref 39.0–52.0)
Hemoglobin: 11.2 g/dL — ABNORMAL LOW (ref 13.0–17.0)
Immature Granulocytes: 0 %
Lymphocytes Relative: 12 %
Lymphs Abs: 0.8 10*3/uL (ref 0.7–4.0)
MCH: 32.4 pg (ref 26.0–34.0)
MCHC: 31.8 g/dL (ref 30.0–36.0)
MCV: 101.7 fL — ABNORMAL HIGH (ref 80.0–100.0)
Monocytes Absolute: 0.4 10*3/uL (ref 0.1–1.0)
Monocytes Relative: 6 %
Neutro Abs: 5.6 10*3/uL (ref 1.7–7.7)
Neutrophils Relative %: 80 %
Platelet Count: 359 10*3/uL (ref 150–400)
RBC: 3.46 MIL/uL — ABNORMAL LOW (ref 4.22–5.81)
RDW: 13.8 % (ref 11.5–15.5)
WBC Count: 7 10*3/uL (ref 4.0–10.5)
nRBC: 0 % (ref 0.0–0.2)

## 2023-08-08 LAB — FERRITIN: Ferritin: 57 ng/mL (ref 24–336)

## 2023-08-08 LAB — VITAMIN B12: Vitamin B-12: 1233 pg/mL — ABNORMAL HIGH (ref 180–914)

## 2023-08-09 LAB — KAPPA/LAMBDA LIGHT CHAINS
Kappa free light chain: 874.9 mg/L — ABNORMAL HIGH (ref 3.3–19.4)
Kappa, lambda light chain ratio: 69.44 — ABNORMAL HIGH (ref 0.26–1.65)
Lambda free light chains: 12.6 mg/L (ref 5.7–26.3)

## 2023-08-09 LAB — HAPTOGLOBIN: Haptoglobin: 257 mg/dL (ref 38–329)

## 2023-08-10 LAB — ZINC: Zinc: 54 ug/dL (ref 44–115)

## 2023-08-14 ENCOUNTER — Inpatient Hospital Stay: Payer: Medicare PPO | Admitting: Oncology

## 2023-08-14 VITALS — BP 113/73 | HR 68 | Temp 97.7°F | Resp 18 | Ht 75.0 in | Wt 192.0 lb

## 2023-08-14 DIAGNOSIS — R54 Age-related physical debility: Secondary | ICD-10-CM

## 2023-08-14 DIAGNOSIS — D472 Monoclonal gammopathy: Secondary | ICD-10-CM

## 2023-08-14 DIAGNOSIS — N183 Chronic kidney disease, stage 3 unspecified: Secondary | ICD-10-CM

## 2023-08-14 LAB — MULTIPLE MYELOMA PANEL, SERUM
Albumin SerPl Elph-Mcnc: 3.7 g/dL (ref 2.9–4.4)
Albumin/Glob SerPl: 1.1 (ref 0.7–1.7)
Alpha 1: 0.3 g/dL (ref 0.0–0.4)
Alpha2 Glob SerPl Elph-Mcnc: 0.9 g/dL (ref 0.4–1.0)
B-Globulin SerPl Elph-Mcnc: 0.9 g/dL (ref 0.7–1.3)
Gamma Glob SerPl Elph-Mcnc: 1.2 g/dL (ref 0.4–1.8)
Globulin, Total: 3.4 g/dL (ref 2.2–3.9)
IgA: 148 mg/dL (ref 61–437)
IgG (Immunoglobin G), Serum: 1535 mg/dL (ref 603–1613)
IgM (Immunoglobulin M), Srm: 6 mg/dL — ABNORMAL LOW (ref 15–143)
M Protein SerPl Elph-Mcnc: 1 g/dL — ABNORMAL HIGH
Total Protein ELP: 7.1 g/dL (ref 6.0–8.5)

## 2023-08-14 LAB — IMMUNOFIXATION ELECTROPHORESIS
IgA: 148 mg/dL (ref 61–437)
IgG (Immunoglobin G), Serum: 1438 mg/dL (ref 603–1613)
IgM (Immunoglobulin M), Srm: 6 mg/dL — ABNORMAL LOW (ref 15–143)
Total Protein ELP: 7.1 g/dL (ref 6.0–8.5)

## 2023-08-14 NOTE — Progress Notes (Signed)
[facility] Cancer Initial Visit:  Patient Care Team: Jim Like, NP as PCP - General (Nurse Practitioner)  CHIEF COMPLAINTS/PURPOSE OF CONSULTATION:    HISTORY OF PRESENTING ILLNESS: Johnny Leblanc 89 y.o. male is here because of  anemia Medical history notable for arthritis, easy bruising (on Plavix and aspirin) Metamucil, coronary artery disease, diverticulosis, GERD, hyperlipidemia, hypertension, abdominal aortic aneurysm B12 deficiency, BPH  June 13, 2022 vitamin B12 1492 June 28, 2022 WBC 7.5 hemoglobin 10.5 MCV 99 platelet count 328; 73 segs 15 lymphs 7 monos 5 eos  August 03, 2022:  WBC 11.0 hemoglobin 10.2 MCV 95 platelet 594; 76 segs 12 lymphs 7 monos 4 eos 1 basophil Ferritin 86  Chemistry is notable for albumin 3.8 creatinine 1.00  August 08 2022:  [facility] Hematology Consult Patient has been noted to have anemia for about 15 yrs per family.   He has taken iron supplements in the past and these have recently been restarted about a week ago.  Has tolerated them in the past.  No history of blood transfusion or IV iron.  Last colonoscopy was about 20 yrs ago.  Does not recall having undergone and EGD.   Ambulates with walker at home.  Here today in a rollator.  Lives with daughter and can do basic ADL's  Social:  widowed.  Worked in 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  Began smoking in high school and quit about 50 yrs ago.  Can't recall how much he smoked.  EtOH none  FMH:   Mother died 95 skin cancer Father died 40 CAD Brother died 51 aneurism  WBC 10.4 hemoglobin 10.4 MCV 99 platelet count 417; 86 segs 6 lymphs 5 monos 3 eos Coombs test negative haptoglobin 283   SPEP with IEP demonstrated an M spike of 1 g/dL of IgG kappa monoclonal protein. serum free kappa 840 lambda 15.8 with a kappa lambda 53.15 IgG 1600 IgA 265 IgM 13 B12 1352 folate 24.5 CMP notable for creatinine 1.57 calcium 8.7 albumin 3.2 total protein 7.8  September 14, 2022 Feraheme 510 mg September 19, 2022 Feraheme 510 mg  October 10 2022:    Reviewed results of labs.  Feels better since receiving IV iron  October 12 2022: 24-hour urine showed loss of 556 mg of total protein of which 274 mg consisted of free light chains with a kappa lambda ratio of 81  November 14 2022:  Bone marrow bx and aspirate attempted but patient too dyspneic to perform  WBC 8.7 hemoglobin 12.1 platelet count 308  November 17, 2022 urine urine immunofixation showed Bence-Jones protein kappa type  December 05 2022:  Reviewed results of labs with patient and daughter.  Patient frustrated at having to go to numerous medical visits.  He would like to keep procedures and visits to a minimum and adopt a more palliative approach.  Feels OK.  Has lost 2 lbs.  ECOG 2 at best  WBC 7.4 hemoglobin 13.1 MCV 103 count 424; 79 segs 12 lymphs 5 monos 3 eos Cr 1.3  Ferritin 179  February 06 2023:   Feels well overall.  Spends much of his time sitting but has been doing a bit more Hgb 11.4 MCV 103  Ferritin 120  May 08 2023:   Spends most of his time sitting, watching TV.  Walks around the house a bit.  Remains on oral iron Hgb 11.0 MCV 102  Retic 1.6 Ferritin 101 Folate 28.2 B12 1574  copper 102 zinc 48 SPEP with IEP demonstrated 0.8 g/dL IgG kappa monoclonal protein.  Serum free kappa 837 lambda 13.8 with a kappa lambda 60.67 IgG 1369 IgA 152 IgM undetectable  June 08 2023:  Discussed various modes of testing vs observation.  Can not tolerate lying flat or on his side for bone marrow test.  Can sit in a recliner therefore can consider sternal aspirate.  24 hr urine collection difficult because of memory issues.   Will get spot urine for UPEP with IFE  August 14, 2023: Scheduled follow-up for anemia.  Per daughter, he is doing fine.  No changes.  Taking iron supplement and calcium with vitamin D.  Discussed changing the timing of the iron so that it does not coincide with calcium.   WBC 7.0  hemoglobin 11.2 platelet count 359; 86 segs 12 lymphs 6 monos 2 eos.  Reticulocyte count 1.4% Serum kappa 875 lambda 12.6 ratio 69.4 IgG 1438 IgA 148 IgM 7 Myeloma panel in process Haptoglobin 257 B12 1233 ferritin 57   Review of Systems  Constitutional:  Negative for chills and fever.  HENT:   Positive for hearing loss. Negative for mouth sores and trouble swallowing.   Respiratory:  Positive for cough. Negative for hemoptysis and shortness of breath.        Slight cough productive of clear mucus.    Cardiovascular:  Positive for leg swelling. Negative for chest pain and palpitations.  Gastrointestinal:  Positive for diarrhea. Negative for abdominal pain, blood in stool, constipation, nausea and vomiting.       Stools dark and greenish on oral iron.  Occasional diarrhea  Genitourinary:  Negative for dysuria, frequency and hematuria.   Musculoskeletal:  Negative for arthralgias, back pain and myalgias.  Neurological:  Positive for extremity weakness. Negative for seizures.       No falls.  Uses walker at home  Hematological:  Negative for adenopathy.       Has easy bruising but no bleeding    MEDICAL HISTORY: Past Medical History:  Diagnosis Date   Arthritis    Bruises easily    pt takes Plavix and office told them to continue his plavix and asa   Constipation    takes Metamucil daily   Coronary artery disease    Diverticulosis    GERD (gastroesophageal reflux disease)    takes Omeprazole daily   H/O hiatal hernia    Hyperlipidemia    takes Lopid daily   Hypertension    takes Maxzide,Procardia,and Metoprolol daily   Nocturia    Stented coronary artery    Urinary frequency     SURGICAL HISTORY: Past Surgical History:  Procedure Laterality Date   ABDOMINAL AORTAGRAM N/A 03/12/2012   Procedure: ABDOMINAL Ronny Flurry;  Surgeon: Fransisco Hertz, MD;  Location: [facility] CATH LAB;  Service: Cardiovascular;  Laterality: N/A;   cataract surgery     bilateral   COLONOSCOPY      CORONARY ANGIOPLASTY  90's/2003/2013   4    SOCIAL HISTORY: Social History   Socioeconomic History   Marital status: Widowed    Spouse name: Not on file   Number of children: 2   Years of education: 71   Highest education level: High school graduate  Occupational History   Not on file  Tobacco Use   Smoking status: Former    Current packs/day: 0.00    Types: Cigarettes    Quit date: 05/22/1981    Years since quitting: 42.2   Smokeless tobacco: Never  Tobacco comments:    quit 30+yrs ago  Vaping Use   Vaping status: Never Used  Substance and Sexual Activity   Alcohol use: No   Drug use: No   Sexual activity: Never  Other Topics Concern   Not on file  Social History Narrative   Not on file   Social Determinants of Health   Financial Resource Strain: Not on file  Food Insecurity: Not on file  Transportation Needs: Not on file  Physical Activity: Not on file  Stress: Not on file  Social Connections: Not on file  Intimate Partner Violence: Not on file    FAMILY HISTORY Family History  Problem Relation Age of Onset   Aneurysm Father    Heart attack Father    Aneurysm Brother    Diabetes Brother    Cancer Mother     ALLERGIES:  is allergic to protonix [pantoprazole].  MEDICATIONS:  Current Outpatient Medications  Medication Sig Dispense Refill   aspirin 325 MG EC tablet Take 325 mg by mouth daily.     brimonidine (ALPHAGAN) 0.2 % ophthalmic solution 2 (two) times daily.     calcium-vitamin D (OSCAL WITH D) 500-5 MG-MCG tablet Take 1 tablet by mouth.     clopidogrel (PLAVIX) 75 MG tablet Take 75 mg by mouth daily.     colesevelam (WELCHOL) 625 MG tablet Take 2,500 mg by mouth 2 (two) times daily with a meal.     ezetimibe (ZETIA) 10 MG tablet Take 10 mg by mouth daily.     Fe Fum-FA-B Cmp-C-Zn-Mg-Mn-Cu (CENTRATEX) 106-1 MG CAPS Take 1 tablet by mouth daily.     ferrous sulfate 324 MG TBEC Take 324 mg by mouth daily with breakfast.     gemfibrozil (LOPID)  600 MG tablet Take 600 mg by mouth 2 (two) times daily before a meal.     Methylcobalamin (B12-ACTIVE) 1 MG CHEW Chew by mouth.     metoprolol succinate (TOPROL-XL) 100 MG 24 hr tablet Take 100 mg by mouth daily.     NIFEdipine (PROCARDIA XL/ADALAT-CC) 60 MG 24 hr tablet Take 60 mg by mouth daily.     nitroGLYCERIN (NITROSTAT) 0.4 MG SL tablet Place 0.4 mg under the tongue every 5 (five) minutes as needed. As needed for chest pain.     omeprazole (PRILOSEC) 20 MG capsule Take 20 mg by mouth daily.     prednisoLONE acetate (PRED FORTE) 1 % ophthalmic suspension daily.     psyllium (HYDROCIL/METAMUCIL) 95 % PACK Take 1 packet by mouth daily.     tamsulosin (FLOMAX) 0.4 MG CAPS capsule Take 0.4 mg by mouth at bedtime.     triamterene-hydrochlorothiazide (MAXZIDE-25) 37.5-25 MG per tablet Take 1 tablet by mouth daily.     Vitamin D, Ergocalciferol, (DRISDOL) 1.25 MG (50000 UNIT) CAPS capsule Take 50,000 Units by mouth every 7 (seven) days.     No current facility-administered medications for this visit.    PHYSICAL EXAMINATION:  ECOG PERFORMANCE STATUS: 3 - Symptomatic, >50% confined to bed   Vitals:   08/14/23 0835  BP: 113/73  Pulse: 68  Resp: 18  Temp: 97.7 F (36.5 C)  SpO2: 95%     Filed Weights   08/14/23 0835  Weight: 192 lb (87.1 kg)      Physical Exam Vitals and nursing note reviewed.  Constitutional:      General: He is not in acute distress.    Appearance: Normal appearance. He is not toxic-appearing or diaphoretic.  Comments: Here with daughter.  Seated in rollator  HENT:     Head: Normocephalic and atraumatic.     Right Ear: External ear normal.     Left Ear: External ear normal.     Nose: Nose normal.  Eyes:     General: No scleral icterus.    Conjunctiva/sclera: Conjunctivae normal.     Pupils: Pupils are equal, round, and reactive to light.  Cardiovascular:     Rate and Rhythm: Normal rate and regular rhythm.     Heart sounds: Normal heart sounds.  No murmur heard.    No friction rub. No gallop.  Pulmonary:     Effort: Pulmonary effort is normal. No respiratory distress.     Breath sounds: Normal breath sounds. No stridor. No wheezing, rhonchi or rales.  Chest:     Chest wall: No tenderness.  Abdominal:     General: Bowel sounds are normal. There is no distension.     Palpations: Abdomen is soft. There is no mass.     Tenderness: There is no abdominal tenderness. There is no guarding or rebound.  Musculoskeletal:        General: No signs of injury.     Cervical back: Normal range of motion and neck supple. No rigidity or tenderness.     Right lower leg: No edema.     Left lower leg: No edema.     Comments: Examined seated in rollator  Lymphadenopathy:     Head:     Right side of head: No submental, submandibular, tonsillar, preauricular, posterior auricular or occipital adenopathy.     Left side of head: No submental, submandibular, tonsillar, preauricular, posterior auricular or occipital adenopathy.     Cervical: No cervical adenopathy.     Right cervical: No superficial, deep or posterior cervical adenopathy.    Left cervical: No superficial, deep or posterior cervical adenopathy.     Upper Body:     Right upper body: No supraclavicular or axillary adenopathy.     Left upper body: No supraclavicular or axillary adenopathy.     Lower Body: No right inguinal adenopathy. No left inguinal adenopathy.  Skin:    Coloration: Skin is not jaundiced.  Neurological:     General: No focal deficit present.     Mental Status: He is alert and oriented to person, place, and time. Mental status is at baseline.     Motor: Weakness present.     Comments: Hard of hearing.  Gait not assessed as patient is in a rollator  Psychiatric:        Mood and Affect: Mood normal.        Behavior: Behavior normal.        Thought Content: Thought content normal.        Judgment: Judgment normal.      LABORATORY DATA: I have personally reviewed  the data as listed:  Appointment on 08/08/2023  Component Date Value Ref Range Status   Kappa free light chain 08/08/2023 874.9 (H)  3.3 - 19.4 mg/L Final   Lambda free light chains 08/08/2023 12.6  5.7 - 26.3 mg/L Final   Kappa, lambda light chain ratio 08/08/2023 69.44 (H)  0.26 - 1.65 Final   Comment: (NOTE) Performed At: [facility] [address] [place], [place] [id number] Jolene Schimke MD OZ:[id number]    Vitamin B-12 08/08/2023 1,233 (H)  180 - 914 pg/mL Final   Comment: (NOTE) This assay is not validated for testing neonatal or myeloproliferative syndrome  specimens for Vitamin B12 levels. Performed at [facility], [address] [place], [place] [address]    Zinc 08/08/2023 54  44 - 115 ug/dL Final   Comment: (NOTE) This test was developed and its performance characteristics determined by [facility]. It has not been cleared or approved by the Food and Drug Administration.                                Detection Limit = 5 Performed At: [facility] [address] [place], [place] [id number] Jolene Schimke MD XB:[id number]    Ferritin 08/08/2023 57  24 - 336 ng/mL Final   Performed at [facility], [address] [place], [place] [address]   Retic Ct Pct 08/08/2023 1.4  0.4 - 3.1 % Final   RBC. 08/08/2023 3.38 (L)  4.22 - 5.81 MIL/uL Final   Retic Count, Absolute 08/08/2023 48.0  19.0 - 186.0 K/uL Final   Immature Retic Fract 08/08/2023 8.0  2.3 - 15.9 % Final   Performed at [facility], [address] [place], [place] [address]   Haptoglobin 08/08/2023 257  38 - 329 mg/dL Final   Comment: (NOTE) Performed At: [facility] [address] [place], [place] [id number] Jolene Schimke MD KG:[id number]    WBC Count 08/08/2023 7.0  4.0 - 10.5 K/uL Final   RBC 08/08/2023 3.46 (L)  4.22 - 5.81 MIL/uL Final   Hemoglobin 08/08/2023 11.2 (L)  13.0 - 17.0 g/dL Final   HCT 64/40/3474 35.2 (L)   39.0 - 52.0 % Final   MCV 08/08/2023 101.7 (H)  80.0 - 100.0 fL Final   MCH 08/08/2023 32.4  26.0 - 34.0 pg Final   MCHC 08/08/2023 31.8  30.0 - 36.0 g/dL Final   RDW 25/95/6387 13.8  11.5 - 15.5 % Final   Platelet Count 08/08/2023 359  150 - 400 K/uL Final   nRBC 08/08/2023 0.0  0.0 - 0.2 % Final   Neutrophils Relative % 08/08/2023 80  % Final   Neutro Abs 08/08/2023 5.6  1.7 - 7.7 K/uL Final   Lymphocytes Relative 08/08/2023 12  % Final   Lymphs Abs 08/08/2023 0.8  0.7 - 4.0 K/uL Final   Monocytes Relative 08/08/2023 6  % Final   Monocytes Absolute 08/08/2023 0.4  0.1 - 1.0 K/uL Final   Eosinophils Relative 08/08/2023 2  % Final   Eosinophils Absolute 08/08/2023 0.2  0.0 - 0.5 K/uL Final   Basophils Relative 08/08/2023 0  % Final   Basophils Absolute 08/08/2023 0.0  0.0 - 0.1 K/uL Final   Immature Granulocytes 08/08/2023 0  % Final   Abs Immature Granulocytes 08/08/2023 0.01  0.00 - 0.07 K/uL Final   Performed at [facility], [address] [place], [place] [address]    RADIOGRAPHIC STUDIES: I have personally reviewed the radiological images as listed and agree with the findings in the report  No results found.  ASSESSMENT/PLAN  89 y.o. male with medical history notable for arthritis, easy bruising (on Plavix and aspirin)  coronary artery disease, diverticulosis, GERD, hyperlipidemia, hypertension, abdominal aortic aneurysm B12 deficiency, BPH who has a long history of anemia  Anemia:  Main component is iron deficiency due to chronic GI blood loss exacerbated by antiplatelet therapy.  CKD another major component.  Testosterone level normal.  Normal haptoglobin indicates hemolysis is unlikely.    November 10 2022:  PSA 1.18  Therapeutics:  Received IV iron without complication  September 14, 2022 Feraheme 510 mg September 19, 2022 Feraheme 510 mg November 14 2022:  Hgb 12.1 indicating a major response to IV iron December 05 2022:  Patient and daughter wish to  pursue a palliative approach will therefore not pursue further evaluation of monoclonal gammopathy.  This is reasonable since he is frail elderly.  Hemoglobin improved to 13.1 February 07 2023- Hgb 11.4.  Hgb has declined since february5 May 08 2023 - Hgb 11.0 MCV 102  Retic 1.6 Ferritin 101 Folate 28.2  Hgb continues to steadily decline despite oral iron.  Iron stores appear adequate.  Does not qualify for ESA.  I suspect the steady decline is due to CKD June 08 2023:  Discussed consideration of bone marrow exam (sternal aspirate) to evaluate degradation in free light chain ratio, and suppression of non-paraprotein immunoglobulins.  Patient and daughter have opted for watchful waiting.     August 14 2023- Hgb 11.2.  Ferritin 57.  B12 1233.  Continue oral iron taking care not to take in simultaneously with calcium supplement.  B12 stores adequate   Monoclonal gammopathy, IgG kappa + kappa:   August 08 2022:  SPEP with IEP 1 g/dL IgG kappa monoclonal protein.  Serum free kappa 840 October 12 2022: 24-hour urine showed loss of 556 mg of total protein of which 274 mg consisted of free light chains with a kappa lambda ratio of 81.  Creatinine clearance measured was 27 May 08 2023- SPEP with IEP demonstrated 0.8 g/dL IgG kappa monoclonal protein.  Serum free kappa 837 lambda 13.8 with a kappa lambda 60.67 IgG 1369 IgA 152 IgM undetectable   August 14 2023- Awaiting results of SPEP with IEP.   Serum kappa 875 lambda 12.6 ratio 69.4   Continuing watchful waiting    Decreased mobility:  Secondary to frail elderly status which may be exacerbated by anemia  Vascular disease:  On ASA and Plavix with likely needs to be continued indefinitely      Cancer Staging  No matching staging information was found for the patient.    No problem-specific Assessment & Plan notes found for this encounter.   No orders of the defined types were placed in this encounter.  30  minutes was spent in patient care.   This included time spent preparing to see the patient (review of tests), counseling and educating the patient/family/caregiver, ordering tests, or procedures; documenting clinical information in the electronic or other health record, independently interpreting results and communicating results to the patient/family/caregiver as well as coordination of care.       All questions were answered. The patient knows to call the clinic with any problems, questions or concerns.  This note was electronically signed.    Loni Muse, MD  08/14/2023 8:48 AM

## 2023-08-16 ENCOUNTER — Telehealth: Payer: Self-pay | Admitting: Oncology

## 2023-08-16 NOTE — Telephone Encounter (Signed)
08/16/23 Spoke with daughter and scheduled next appt in .

## 2023-08-25 ENCOUNTER — Encounter: Payer: Self-pay | Admitting: Oncology

## 2023-08-28 DIAGNOSIS — Z6823 Body mass index (BMI) 23.0-23.9, adult: Secondary | ICD-10-CM | POA: Diagnosis not present

## 2023-08-28 DIAGNOSIS — D509 Iron deficiency anemia, unspecified: Secondary | ICD-10-CM | POA: Diagnosis not present

## 2023-08-28 DIAGNOSIS — R42 Dizziness and giddiness: Secondary | ICD-10-CM | POA: Diagnosis not present

## 2023-08-28 DIAGNOSIS — J3489 Other specified disorders of nose and nasal sinuses: Secondary | ICD-10-CM | POA: Diagnosis not present

## 2023-08-28 DIAGNOSIS — R1310 Dysphagia, unspecified: Secondary | ICD-10-CM | POA: Diagnosis not present

## 2023-08-28 DIAGNOSIS — Z23 Encounter for immunization: Secondary | ICD-10-CM | POA: Diagnosis not present

## 2023-09-06 DIAGNOSIS — Z6823 Body mass index (BMI) 23.0-23.9, adult: Secondary | ICD-10-CM | POA: Diagnosis not present

## 2023-09-06 DIAGNOSIS — K2100 Gastro-esophageal reflux disease with esophagitis, without bleeding: Secondary | ICD-10-CM | POA: Diagnosis not present

## 2023-10-31 DIAGNOSIS — Z6823 Body mass index (BMI) 23.0-23.9, adult: Secondary | ICD-10-CM | POA: Diagnosis not present

## 2023-10-31 DIAGNOSIS — D509 Iron deficiency anemia, unspecified: Secondary | ICD-10-CM | POA: Diagnosis not present

## 2023-10-31 DIAGNOSIS — E785 Hyperlipidemia, unspecified: Secondary | ICD-10-CM | POA: Diagnosis not present

## 2023-10-31 DIAGNOSIS — I2510 Atherosclerotic heart disease of native coronary artery without angina pectoris: Secondary | ICD-10-CM | POA: Diagnosis not present

## 2023-10-31 DIAGNOSIS — K2100 Gastro-esophageal reflux disease with esophagitis, without bleeding: Secondary | ICD-10-CM | POA: Diagnosis not present

## 2023-10-31 DIAGNOSIS — I10 Essential (primary) hypertension: Secondary | ICD-10-CM | POA: Diagnosis not present

## 2023-11-14 ENCOUNTER — Inpatient Hospital Stay: Payer: Medicare PPO

## 2023-11-14 ENCOUNTER — Telehealth: Payer: Self-pay | Admitting: Oncology

## 2023-11-14 ENCOUNTER — Inpatient Hospital Stay: Payer: Medicare PPO | Attending: Oncology | Admitting: Oncology

## 2023-11-14 VITALS — BP 119/70 | HR 69 | Temp 98.2°F | Resp 20 | Ht 75.0 in

## 2023-11-14 DIAGNOSIS — Z7409 Other reduced mobility: Secondary | ICD-10-CM | POA: Diagnosis not present

## 2023-11-14 DIAGNOSIS — K922 Gastrointestinal hemorrhage, unspecified: Secondary | ICD-10-CM | POA: Diagnosis not present

## 2023-11-14 DIAGNOSIS — Z833 Family history of diabetes mellitus: Secondary | ICD-10-CM | POA: Insufficient documentation

## 2023-11-14 DIAGNOSIS — R54 Age-related physical debility: Secondary | ICD-10-CM

## 2023-11-14 DIAGNOSIS — M1990 Unspecified osteoarthritis, unspecified site: Secondary | ICD-10-CM | POA: Insufficient documentation

## 2023-11-14 DIAGNOSIS — Z87891 Personal history of nicotine dependence: Secondary | ICD-10-CM | POA: Diagnosis not present

## 2023-11-14 DIAGNOSIS — Z7902 Long term (current) use of antithrombotics/antiplatelets: Secondary | ICD-10-CM | POA: Insufficient documentation

## 2023-11-14 DIAGNOSIS — K219 Gastro-esophageal reflux disease without esophagitis: Secondary | ICD-10-CM | POA: Insufficient documentation

## 2023-11-14 DIAGNOSIS — Z8679 Personal history of other diseases of the circulatory system: Secondary | ICD-10-CM | POA: Insufficient documentation

## 2023-11-14 DIAGNOSIS — I2510 Atherosclerotic heart disease of native coronary artery without angina pectoris: Secondary | ICD-10-CM | POA: Insufficient documentation

## 2023-11-14 DIAGNOSIS — Z809 Family history of malignant neoplasm, unspecified: Secondary | ICD-10-CM | POA: Diagnosis not present

## 2023-11-14 DIAGNOSIS — N183 Chronic kidney disease, stage 3 unspecified: Secondary | ICD-10-CM

## 2023-11-14 DIAGNOSIS — E538 Deficiency of other specified B group vitamins: Secondary | ICD-10-CM | POA: Diagnosis not present

## 2023-11-14 DIAGNOSIS — E785 Hyperlipidemia, unspecified: Secondary | ICD-10-CM | POA: Insufficient documentation

## 2023-11-14 DIAGNOSIS — N400 Enlarged prostate without lower urinary tract symptoms: Secondary | ICD-10-CM | POA: Diagnosis not present

## 2023-11-14 DIAGNOSIS — Z79899 Other long term (current) drug therapy: Secondary | ICD-10-CM | POA: Diagnosis not present

## 2023-11-14 DIAGNOSIS — N2889 Other specified disorders of kidney and ureter: Secondary | ICD-10-CM

## 2023-11-14 DIAGNOSIS — D509 Iron deficiency anemia, unspecified: Secondary | ICD-10-CM | POA: Insufficient documentation

## 2023-11-14 DIAGNOSIS — D539 Nutritional anemia, unspecified: Secondary | ICD-10-CM | POA: Diagnosis not present

## 2023-11-14 DIAGNOSIS — D472 Monoclonal gammopathy: Secondary | ICD-10-CM | POA: Diagnosis not present

## 2023-11-14 DIAGNOSIS — Z8249 Family history of ischemic heart disease and other diseases of the circulatory system: Secondary | ICD-10-CM | POA: Diagnosis not present

## 2023-11-14 DIAGNOSIS — I129 Hypertensive chronic kidney disease with stage 1 through stage 4 chronic kidney disease, or unspecified chronic kidney disease: Secondary | ICD-10-CM | POA: Diagnosis not present

## 2023-11-14 DIAGNOSIS — N189 Chronic kidney disease, unspecified: Secondary | ICD-10-CM | POA: Insufficient documentation

## 2023-11-14 LAB — CBC WITH DIFFERENTIAL/PLATELET
Abs Immature Granulocytes: 0.03 10*3/uL (ref 0.00–0.07)
Basophils Absolute: 0 10*3/uL (ref 0.0–0.1)
Basophils Relative: 0 %
Eosinophils Absolute: 0.1 10*3/uL (ref 0.0–0.5)
Eosinophils Relative: 1 %
HCT: 29.6 % — ABNORMAL LOW (ref 39.0–52.0)
Hemoglobin: 9.7 g/dL — ABNORMAL LOW (ref 13.0–17.0)
Immature Granulocytes: 0 %
Lymphocytes Relative: 5 %
Lymphs Abs: 0.4 10*3/uL — ABNORMAL LOW (ref 0.7–4.0)
MCH: 30.3 pg (ref 26.0–34.0)
MCHC: 32.8 g/dL (ref 30.0–36.0)
MCV: 92.5 fL (ref 80.0–100.0)
Monocytes Absolute: 0.1 10*3/uL (ref 0.1–1.0)
Monocytes Relative: 2 %
Neutro Abs: 6.4 10*3/uL (ref 1.7–7.7)
Neutrophils Relative %: 92 %
Platelets: 372 10*3/uL (ref 150–400)
RBC: 3.2 MIL/uL — ABNORMAL LOW (ref 4.22–5.81)
RDW: 18.6 % — ABNORMAL HIGH (ref 11.5–15.5)
WBC: 7.1 10*3/uL (ref 4.0–10.5)
nRBC: 0 % (ref 0.0–0.2)
nRBC: 0 /100{WBCs}

## 2023-11-14 LAB — COMPREHENSIVE METABOLIC PANEL
ALT: 6 U/L (ref 0–44)
AST: 14 U/L — ABNORMAL LOW (ref 15–41)
Albumin: 3.7 g/dL (ref 3.5–5.0)
Alkaline Phosphatase: 83 U/L (ref 38–126)
Anion gap: 13 (ref 5–15)
BUN: 21 mg/dL (ref 8–23)
CO2: 26 mmol/L (ref 22–32)
Calcium: 9.3 mg/dL (ref 8.9–10.3)
Chloride: 100 mmol/L (ref 98–111)
Creatinine, Ser: 1.67 mg/dL — ABNORMAL HIGH (ref 0.61–1.24)
GFR, Estimated: 38 mL/min — ABNORMAL LOW (ref >60)
Glucose, Bld: 107 mg/dL — ABNORMAL HIGH (ref 70–99)
Potassium: 3.4 mmol/L — ABNORMAL LOW (ref 3.5–5.1)
Sodium: 139 mmol/L (ref 135–145)
Total Bilirubin: 0.6 mg/dL (ref 0.0–1.2)
Total Protein: 6.7 g/dL (ref 6.5–8.1)

## 2023-11-14 LAB — FERRITIN: Ferritin: 56 ng/mL (ref 24–336)

## 2023-11-14 NOTE — Telephone Encounter (Signed)
 11/14/23 Spoke with patient and confirmed next appt

## 2023-11-14 NOTE — Progress Notes (Signed)
  [facility] Initial Visit:  Patient Care Team: Pandora Therisa RAMAN, NP as PCP - General (Nurse Practitioner)  CHIEF COMPLAINTS/PURPOSE OF CONSULTATION:    HISTORY OF PRESENTING ILLNESS: Johnny Leblanc 89 y.o. male is here because of  anemia Medical history notable for arthritis, easy bruising (on Plavix  and aspirin ) Metamucil, coronary artery disease, diverticulosis, GERD, hyperlipidemia, hypertension, abdominal aortic aneurysm B12 deficiency, BPH  June 13, 2022 vitamin B12 1492 June 28, 2022 WBC 7.5 hemoglobin 10.5 MCV 99 platelet count 328; 73 segs 15 lymphs 7 monos 5 eos  August 03, 2022:  WBC 11.0 hemoglobin 10.2 MCV 95 platelet 594; 76 segs 12 lymphs 7 monos 4 eos 1 basophil Ferritin 86  Chemistry is notable for albumin 3.8 creatinine 1.00  August 08 2022:  [facility] Hematology Consult Patient has been noted to have anemia for about 15 yrs per family.   He has taken iron  supplements in the past and these have recently been restarted about a week ago.  Has tolerated them in the past.  No history of blood transfusion or IV iron .  Last colonoscopy was about 20 yrs ago.  Does not recall having undergone and EGD.   Ambulates with walker at home.  Here today in a rollator.  Lives with daughter and can do basic ADL's  Social:  widowed.  Worked in 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  Began smoking in high school and quit about 50 yrs ago.  Can't recall how much he smoked.  EtOH none  FMH:   Mother died 26 skin cancer Father died 74 CAD Brother died 42 aneurism  WBC 10.4 hemoglobin 10.4 MCV 99 platelet count 417; 86 segs 6 lymphs 5 monos 3 eos Coombs test negative haptoglobin 283   SPEP with IEP demonstrated an M spike of 1 g/dL of IgG kappa monoclonal protein. serum free kappa 840 lambda 15.8 with a kappa lambda 53.15 IgG 1600 IgA 265 IgM 13 B12 1352 folate 24.5 CMP notable for creatinine 1.57 calcium 8.7 albumin 3.2 total protein 7.8  September 14, 2022 Feraheme  510 mg September 19, 2022 Feraheme  510 mg  October 10 2022:    Reviewed results of labs.  Feels better since receiving IV iron   October 12 2022: 24-hour urine showed loss of 556 mg of total protein of which 274 mg consisted of free light chains with a kappa lambda ratio of 81  November 14 2022:  Bone marrow bx and aspirate attempted but patient too dyspneic to perform  WBC 8.7 hemoglobin 12.1 platelet count 308  November 17, 2022 urine urine immunofixation showed Bence-Jones protein kappa type  December 05 2022:  Reviewed results of labs with patient and daughter.  Patient frustrated at having to go to numerous medical visits.  He would like to keep procedures and visits to a minimum and adopt a more palliative approach.  Feels OK.  Has lost 2 lbs.  ECOG 2 at best  WBC 7.4 hemoglobin 13.1 MCV 103 count 424; 79 segs 12 lymphs 5 monos 3 eos Cr 1.3  Ferritin 179  February 06 2023:   Feels well overall.  Spends much of his time sitting but has been doing a bit more Hgb 11.4 MCV 103  Ferritin 120  May 08 2023:   Spends most of his time sitting, watching TV.  Walks around the house a bit.  Remains on oral iron  Hgb 11.0 MCV 102  Retic 1.6 Ferritin 101 Folate 28.2 B12 1574  copper  102 zinc  48 SPEP with IEP demonstrated 0.8 g/dL IgG kappa monoclonal protein.  Serum free kappa 837 lambda 13.8 with a kappa lambda 60.67 IgG 1369 IgA 152 IgM undetectable  June 08 2023:  Discussed various modes of testing vs observation.  Can not tolerate lying flat or on his side for bone marrow test.  Can sit in a recliner therefore can consider sternal aspirate.  24 hr urine collection difficult because of memory issues.   Spot urine for UPEP with IFE demonstrated total protein of 97.2 mg/dL protein with 42.0% of this being kappa bence jones protein and an additional M spike constituting 9% of total protein  August 14, 2023:   Per daughter, he is doing fine.  No changes.  Taking iron  supplement and  calcium with vitamin D.  Discussed changing the timing of the iron  so that it does not coincide with calcium.   WBC 7.0 hemoglobin 11.2 platelet count 359; 86 segs 12 lymphs 6 monos 2 eos.  Reticulocyte count 1.4%  SPEP with IEP showed 1.0 g/dL of IgG kappa monoclonal protein Serum kappa 875 lambda 12.6 ratio 69.4 IgG 1438 IgA 148 IgM 7 Haptoglobin 257 B12 1233 ferritin 57  November 14 2023:  Scheduled follow-up for anemia.  Doing fine.  No changes.  Remains on iron , calcium and vitamin D but takes them intermittently.    Hgb 9.7 MCV 92.5 Cr 1.67  Review of Systems  Constitutional:  Negative for chills and fever.  HENT:   Positive for hearing loss. Negative for mouth sores and trouble swallowing.   Respiratory:  Positive for cough. Negative for hemoptysis and shortness of breath.        Slight cough productive of clear mucus.    Cardiovascular:  Positive for leg swelling. Negative for chest pain and palpitations.  Gastrointestinal:  Positive for diarrhea. Negative for abdominal pain, blood in stool, constipation, nausea and vomiting.       Stools dark and greenish on oral iron .  Occasional diarrhea  Genitourinary:  Negative for dysuria, frequency and hematuria.   Musculoskeletal:  Negative for arthralgias, back pain and myalgias.  Neurological:  Positive for extremity weakness. Negative for seizures.       No falls.  Uses walker at home  Hematological:  Negative for adenopathy.       Has easy bruising but no bleeding    MEDICAL HISTORY: Past Medical History:  Diagnosis Date   Arthritis    Bruises easily    pt takes Plavix  and office told them to continue his plavix  and asa   Constipation    takes Metamucil daily   Coronary artery disease    Diverticulosis    GERD (gastroesophageal reflux disease)    takes Omeprazole daily   H/O hiatal hernia    Hyperlipidemia    takes Lopid  daily   Hypertension    takes Maxzide ,Procardia ,and Metoprolol  daily   Nocturia    Stented  coronary artery    Urinary frequency     SURGICAL HISTORY: Past Surgical History:  Procedure Laterality Date   ABDOMINAL AORTAGRAM N/A 03/12/2012   Procedure: ABDOMINAL EZELLA;  Surgeon: Redell LITTIE Door, MD;  Location: [facility] CATH LAB;  Service: Cardiovascular;  Laterality: N/A;   cataract surgery     bilateral   COLONOSCOPY     CORONARY ANGIOPLASTY  90's/2003/2013   4    SOCIAL HISTORY: Social History   Socioeconomic History   Marital status: Widowed    Spouse name: Not on file  Number of children: 2   Years of education: 12   Highest education level: High school graduate  Occupational History   Not on file  Tobacco Use   Smoking status: Former    Current packs/day: 0.00    Types: Cigarettes    Quit date: 05/22/1981    Years since quitting: 42.5   Smokeless tobacco: Never   Tobacco comments:    quit 30+yrs ago  Vaping Use   Vaping status: Never Used  Substance and Sexual Activity   Alcohol use: No   Drug use: No   Sexual activity: Never  Other Topics Concern   Not on file  Social History Narrative   Not on file   Social Drivers of Health   Financial Resource Strain: Not on file  Food Insecurity: Not on file  Transportation Needs: Not on file  Physical Activity: Not on file  Stress: Not on file  Social Connections: Not on file  Intimate Partner Violence: Not on file    FAMILY HISTORY Family History  Problem Relation Age of Onset   Aneurysm Father    Heart attack Father    Aneurysm Brother    Diabetes Brother    Cancer Mother     ALLERGIES:  is allergic to protonix  [pantoprazole ].  MEDICATIONS:  Current Outpatient Medications  Medication Sig Dispense Refill   aspirin  325 MG EC tablet Take 325 mg by mouth daily.     brimonidine (ALPHAGAN) 0.2 % ophthalmic solution 2 (two) times daily.     calcium-vitamin D (OSCAL WITH D) 500-5 MG-MCG tablet Take 1 tablet by mouth.     clopidogrel  (PLAVIX ) 75 MG tablet Take 75 mg by mouth daily.     colesevelam   (WELCHOL ) 625 MG tablet Take 2,500 mg by mouth 2 (two) times daily with a meal.     ezetimibe (ZETIA) 10 MG tablet Take 10 mg by mouth daily.     Fe Fum-FA-B Cmp-C-Zn-Mg-Mn-Cu (CENTRATEX) 106-1 MG CAPS Take 1 tablet by mouth daily.     ferrous sulfate 324 MG TBEC Take 324 mg by mouth daily with breakfast.     gemfibrozil  (LOPID ) 600 MG tablet Take 600 mg by mouth 2 (two) times daily before a meal.     Methylcobalamin (B12-ACTIVE) 1 MG CHEW Chew by mouth.     metoprolol  succinate (TOPROL -XL) 100 MG 24 hr tablet Take 100 mg by mouth daily.     NIFEdipine  (PROCARDIA  XL/ADALAT -CC) 60 MG 24 hr tablet Take 60 mg by mouth daily.     nitroGLYCERIN  (NITROSTAT ) 0.4 MG SL tablet Place 0.4 mg under the tongue every 5 (five) minutes as needed. As needed for chest pain.     omeprazole (PRILOSEC) 20 MG capsule Take 20 mg by mouth daily.     prednisoLONE acetate (PRED FORTE) 1 % ophthalmic suspension daily.     psyllium (HYDROCIL/METAMUCIL) 95 % PACK Take 1 packet by mouth daily.     tamsulosin (FLOMAX) 0.4 MG CAPS capsule Take 0.4 mg by mouth at bedtime.     triamterene -hydrochlorothiazide  (MAXZIDE -25) 37.5-25 MG per tablet Take 1 tablet by mouth daily.     Vitamin D, Ergocalciferol, (DRISDOL) 1.25 MG (50000 UNIT) CAPS capsule Take 50,000 Units by mouth every 7 (seven) days.     No current facility-administered medications for this visit.    PHYSICAL EXAMINATION:  ECOG PERFORMANCE STATUS: 3 - Symptomatic, >50% confined to bed   There were no vitals filed for this visit.    There were no vitals filed  for this visit.     Physical Exam Vitals and nursing note reviewed.  Constitutional:      General: He is not in acute distress.    Appearance: Normal appearance. He is not toxic-appearing or diaphoretic.     Comments: Here with daughter.  Seated in rollator  HENT:     Head: Normocephalic and atraumatic.     Right Ear: External ear normal.     Left Ear: External ear normal.     Nose: Nose  normal.  Eyes:     General: No scleral icterus.    Conjunctiva/sclera: Conjunctivae normal.     Pupils: Pupils are equal, round, and reactive to light.  Cardiovascular:     Rate and Rhythm: Normal rate and regular rhythm.     Heart sounds: Normal heart sounds. No murmur heard.    No friction rub. No gallop.  Pulmonary:     Effort: Pulmonary effort is normal. No respiratory distress.     Breath sounds: Normal breath sounds. No stridor. No wheezing, rhonchi or rales.  Chest:     Chest wall: No tenderness.  Abdominal:     General: Bowel sounds are normal. There is no distension.     Palpations: Abdomen is soft. There is no mass.     Tenderness: There is no abdominal tenderness. There is no guarding or rebound.  Musculoskeletal:        General: No signs of injury.     Cervical back: Normal range of motion and neck supple. No rigidity or tenderness.     Right lower leg: No edema.     Left lower leg: No edema.     Comments: Examined seated in rollator  Lymphadenopathy:     Head:     Right side of head: No submental, submandibular, tonsillar, preauricular, posterior auricular or occipital adenopathy.     Left side of head: No submental, submandibular, tonsillar, preauricular, posterior auricular or occipital adenopathy.     Cervical: No cervical adenopathy.     Right cervical: No superficial, deep or posterior cervical adenopathy.    Left cervical: No superficial, deep or posterior cervical adenopathy.     Upper Body:     Right upper body: No supraclavicular or axillary adenopathy.     Left upper body: No supraclavicular or axillary adenopathy.     Lower Body: No right inguinal adenopathy. No left inguinal adenopathy.  Skin:    Coloration: Skin is not jaundiced.  Neurological:     General: No focal deficit present.     Mental Status: He is alert and oriented to person, place, and time. Mental status is at baseline.     Motor: Weakness present.     Comments: Hard of hearing.  Gait  not assessed as patient is in a rollator  Psychiatric:        Mood and Affect: Mood normal.        Behavior: Behavior normal.        Thought Content: Thought content normal.        Judgment: Judgment normal.     LABORATORY DATA: I have personally reviewed the data as listed:  No visits with results within 1 Month(s) from this visit.  Latest known visit with results is:  Appointment on 08/08/2023  Component Date Value Ref Range Status   Kappa free light chain 08/08/2023 874.9 (H)  3.3 - 19.4 mg/L Final   Lambda free light chains 08/08/2023 12.6  5.7 - 26.3 mg/L Final  Kappa, lambda light chain ratio 08/08/2023 69.44 (H)  0.26 - 1.65 Final   Comment: (NOTE) Performed At: [facility] [address] [place], [place] [id number] Jennette Shorter MD Ey:[id number]    IgG (Immunoglobin G), Serum 08/08/2023 1,535  603 - 1,613 mg/dL Final   IgA 89/91/7975 148  61 - 437 mg/dL Final   IgM (Immunoglobulin M), Srm 08/08/2023 6 (L)  15 - 143 mg/dL Final   Result confirmed on concentration.   Total Protein ELP 08/08/2023 7.1  6.0 - 8.5 g/dL Corrected   Albumin SerPl Elph-Mcnc 08/08/2023 3.7  2.9 - 4.4 g/dL Corrected   Alpha 1 89/91/7975 0.3  0.0 - 0.4 g/dL Corrected   Alpha2 Glob SerPl Elph-Mcnc 08/08/2023 0.9  0.4 - 1.0 g/dL Corrected   B-Globulin SerPl Elph-Mcnc 08/08/2023 0.9  0.7 - 1.3 g/dL Corrected   Gamma Glob SerPl Elph-Mcnc 08/08/2023 1.2  0.4 - 1.8 g/dL Corrected   M Protein SerPl Elph-Mcnc 08/08/2023 1.0 (H)  Not Observed g/dL Corrected   Globulin, Total 08/08/2023 3.4  2.2 - 3.9 g/dL Corrected   Albumin/Glob SerPl 08/08/2023 1.1  0.7 - 1.7 Corrected   IFE 1 08/08/2023 Comment (A)   Corrected   Comment: (NOTE) Immunofixation shows IgG monoclonal protein with kappa light chain specificity. PLEASE NOTE: Samples from patients receiving DARZALEX(R) (daratumumab) or SARCLISA(R)(isatuximab-irfc) treatment can appear as an IgG kappa and mask a complete response (CR). If this  patient is receiving these therapies, this IFE assay interference can be removed by ordering test number 123218-Immunofixation, Daratumumab-Specific, Serum or 123062-Immunofixation, Isatuximab-Specific, Serum and submitting a new sample for testing or by calling the lab to add this test to the current sample. Immunofixation shows IgA monoclonal protein with kappa light chain specificity.    Please Note 08/08/2023 Comment   Corrected   Comment: (NOTE) Protein electrophoresis scan will follow via computer, mail, or courier delivery. Performed At: [facility] [address] [place], [place] [id number] Jennette Shorter MD Ey:[id number]    Total Protein ELP 08/08/2023 7.1  6.0 - 8.5 g/dL Final   IgG (Immunoglobin G), Serum 08/08/2023 1,438  603 - 1,613 mg/dL Final   IgA 89/91/7975 148  61 - 437 mg/dL Final   IgM (Immunoglobulin M), Srm 08/08/2023 6 (L)  15 - 143 mg/dL Final   Comment: (NOTE) Result confirmed on concentration. Performed At: [facility] [address] [place], [place] [id number] Jennette Shorter MD Ey:[id number]    Immunofixation Result, Serum 08/08/2023 Comment (A)   Corrected   Comment: (NOTE) Immunofixation shows IgG monoclonal protein with kappa light chain specificity. PLEASE NOTE: Samples from patients receiving DARZALEX(R) (daratumumab) or SARCLISA(R)(isatuximab-irfc) treatment can appear as an IgG kappa and mask a complete response (CR). If this patient is receiving these therapies, this IFE assay interference can be removed by ordering test number 123218-Immunofixation, Daratumumab-Specific, Serum or 123062-Immunofixation, Isatuximab-Specific, Serum and submitting a new sample for testing or by calling the lab to add this test to the current sample.    Vitamin B-12 08/08/2023 1,233 (H)  180 - 914 pg/mL Final   Comment: (NOTE) This assay is not validated for testing neonatal or myeloproliferative syndrome specimens for Vitamin  B12 levels. Performed at [facility], [address], [place], [place] [address]    Zinc  08/08/2023 54  44 - 115 ug/dL Final   Comment: (NOTE) This test was developed and its performance characteristics determined by [facility]. It has not been cleared or approved by the Food and Drug Administration.  Detection Limit = 5 Performed At: [facility] [address] [place], [place] [id number] Jennette Shorter MD Ey:[id number]    Ferritin 08/08/2023 57  24 - 336 ng/mL Final   Performed at [facility], [address], [place], [place] [address]   Retic Ct Pct 08/08/2023 1.4  0.4 - 3.1 % Final   RBC. 08/08/2023 3.38 (L)  4.22 - 5.81 MIL/uL Final   Retic Count, Absolute 08/08/2023 48.0  19.0 - 186.0 K/uL Final   Immature Retic Fract 08/08/2023 8.0  2.3 - 15.9 % Final   Performed at [facility], [address] [place], [place] [address]   Haptoglobin 08/08/2023 257  38 - 329 mg/dL Final   Comment: (NOTE) Performed At: [facility] [address] [place], [place] [id number] Jennette Shorter MD Ey:[id number]    WBC Count 08/08/2023 7.0  4.0 - 10.5 K/uL Final   RBC 08/08/2023 3.46 (L)  4.22 - 5.81 MIL/uL Final   Hemoglobin 08/08/2023 11.2 (L)  13.0 - 17.0 g/dL Final   HCT 89/91/7975 35.2 (L)  39.0 - 52.0 % Final   MCV 08/08/2023 101.7 (H)  80.0 - 100.0 fL Final   MCH 08/08/2023 32.4  26.0 - 34.0 pg Final   MCHC 08/08/2023 31.8  30.0 - 36.0 g/dL Final   RDW 89/91/7975 13.8  11.5 - 15.5 % Final   Platelet Count 08/08/2023 359  150 - 400 K/uL Final   nRBC 08/08/2023 0.0  0.0 - 0.2 % Final   Neutrophils Relative % 08/08/2023 80  % Final   Neutro Abs 08/08/2023 5.6  1.7 - 7.7 K/uL Final   Lymphocytes Relative 08/08/2023 12  % Final   Lymphs Abs 08/08/2023 0.8  0.7 - 4.0 K/uL Final   Monocytes Relative 08/08/2023 6  % Final   Monocytes Absolute 08/08/2023 0.4  0.1 - 1.0 K/uL Final    Eosinophils Relative 08/08/2023 2  % Final   Eosinophils Absolute 08/08/2023 0.2  0.0 - 0.5 K/uL Final   Basophils Relative 08/08/2023 0  % Final   Basophils Absolute 08/08/2023 0.0  0.0 - 0.1 K/uL Final   Immature Granulocytes 08/08/2023 0  % Final   Abs Immature Granulocytes 08/08/2023 0.01  0.00 - 0.07 K/uL Final   Performed at [facility], [address] [place], [place] [address]    RADIOGRAPHIC STUDIES: I have personally reviewed the radiological images as listed and agree with the findings in the report  No results found.  ASSESSMENT/PLAN  89 y.o. male with medical history notable for arthritis, easy bruising (on Plavix  and aspirin )  coronary artery disease, diverticulosis, GERD, hyperlipidemia, hypertension, abdominal aortic aneurysm B12 deficiency, BPH who has a long history of anemia  Anemia:  Main component is iron  deficiency due to chronic GI blood loss exacerbated by antiplatelet therapy.  CKD another major component.  Testosterone  level normal.  Normal haptoglobin indicates hemolysis is unlikely.    November 10 2022:  PSA 1.18    Therapeutics:  Received IV iron  without complication  September 14, 2022 Feraheme  510 mg September 19, 2022 Feraheme  510 mg November 14 2022:  Hgb 12.1 indicating a major response to IV iron  December 05 2022:  Patient and daughter wish to pursue a palliative approach will therefore not pursue further evaluation of monoclonal gammopathy.  This is reasonable since he is frail elderly.  Hemoglobin improved to 13.1 February 07 2023- Hgb 11.4.  Hgb has declined since february5 May 08 2023 - Hgb 11.0  MCV 102  Retic 1.6 Ferritin 101 Folate 28.2  Hgb continues to steadily decline despite oral iron .  Iron  stores appear adequate.  Does not qualify for ESA.  I suspect the steady decline is due to CKD June 08 2023:  Discussed consideration of bone marrow exam (sternal aspirate) to evaluate degradation in free light chain ratio, and suppression of  non-paraprotein immunoglobulins.  Patient and daughter have opted for watchful waiting.     August 14 2023- Hgb 11.2.  Ferritin 57.  B12 1233.  Continue oral iron  taking care not to take in simultaneously with calcium supplement.  B12 stores adequate  November 14 2023- Hgb 9.7 MCV 92.5 Cr 1.67.  Ferritin pending but has been trending downward along with MCV.  Arranging for IV iron .  Consider ESA if inadequate response to parenteral iron .  Notable that Cr has worsened since last visit.    Monoclonal gammopathy, IgG kappa + kappa:   August 08 2022:  SPEP with IEP 1 g/dL IgG kappa monoclonal protein.  Serum free kappa 840 October 12 2022: 24-hour urine showed loss of 556 mg of total protein of which 274 mg consisted of free light chains with a kappa lambda ratio of 81.  Creatinine clearance measured was 27 May 08 2023- SPEP with IEP demonstrated 0.8 g/dL IgG kappa monoclonal protein.  Serum free kappa 837 lambda 13.8 with a kappa lambda 60.67 IgG 1369 IgA 152 IgM undetectable  June 08 2023:  24 hr urine collection difficult because of memory issues. Spot showed total protein of 97.2 mg/dL protein with 42.0% of this being kappa bence jones protein and an additional M spike constituting 9% of total protein  August 14 2023- SPEP with IEP showed 1.0 g/dL of IgG kappa monoclonal protein .   Serum kappa 875 lambda 12.6 ratio 69.4   Continuing watchful waiting  November 14 2023- SPEP with IEP, free light chains ordered.  Held on spot urine due to mobility issues.  If there is a definite trend toward worsening renal fxn would consider bortezomib for therapy   Decreased mobility:  Secondary to frail elderly status which may be exacerbated by anemia  Vascular disease:  On ASA and Plavix  with likely needs to be continued indefinitely      Cancer Staging  No matching staging information was found for the patient.    No problem-specific Assessment & Plan notes found for this encounter.   No orders of  the defined types were placed in this encounter.  40  minutes was spent in patient care.  This included time spent preparing to see the patient (review of tests), counseling and educating the patient/family/caregiver, ordering tests, or procedures; documenting clinical information in the electronic or other health record, independently interpreting results and communicating results to the patient/family/caregiver as well as coordination of care.       All questions were answered. The patient knows to call the clinic with any problems, questions or concerns.  This note was electronically signed.    Guillermina JAYSON Perla, MD  11/14/2023 8:49 AM

## 2023-11-15 LAB — KAPPA/LAMBDA LIGHT CHAINS
Kappa free light chain: 869.3 mg/L — ABNORMAL HIGH (ref 3.3–19.4)
Kappa, lambda light chain ratio: 79.03 — ABNORMAL HIGH (ref 0.26–1.65)
Lambda free light chains: 11 mg/L (ref 5.7–26.3)

## 2023-11-16 ENCOUNTER — Encounter: Payer: Self-pay | Admitting: Oncology

## 2023-11-16 DIAGNOSIS — D509 Iron deficiency anemia, unspecified: Secondary | ICD-10-CM | POA: Insufficient documentation

## 2023-11-20 ENCOUNTER — Inpatient Hospital Stay: Payer: Medicare PPO

## 2023-11-20 VITALS — BP 96/54 | HR 67 | Resp 54 | Ht 75.0 in

## 2023-11-20 DIAGNOSIS — D509 Iron deficiency anemia, unspecified: Secondary | ICD-10-CM | POA: Diagnosis not present

## 2023-11-20 DIAGNOSIS — N400 Enlarged prostate without lower urinary tract symptoms: Secondary | ICD-10-CM | POA: Diagnosis not present

## 2023-11-20 DIAGNOSIS — I2510 Atherosclerotic heart disease of native coronary artery without angina pectoris: Secondary | ICD-10-CM | POA: Diagnosis not present

## 2023-11-20 DIAGNOSIS — D472 Monoclonal gammopathy: Secondary | ICD-10-CM | POA: Diagnosis not present

## 2023-11-20 DIAGNOSIS — E538 Deficiency of other specified B group vitamins: Secondary | ICD-10-CM | POA: Diagnosis not present

## 2023-11-20 DIAGNOSIS — K922 Gastrointestinal hemorrhage, unspecified: Secondary | ICD-10-CM | POA: Diagnosis not present

## 2023-11-20 DIAGNOSIS — M1990 Unspecified osteoarthritis, unspecified site: Secondary | ICD-10-CM | POA: Diagnosis not present

## 2023-11-20 DIAGNOSIS — K219 Gastro-esophageal reflux disease without esophagitis: Secondary | ICD-10-CM | POA: Diagnosis not present

## 2023-11-20 DIAGNOSIS — E785 Hyperlipidemia, unspecified: Secondary | ICD-10-CM | POA: Diagnosis not present

## 2023-11-20 MED ORDER — SODIUM CHLORIDE 0.9 % IV SOLN: INTRAVENOUS | Status: DC

## 2023-11-20 MED ORDER — IRON SUCROSE 20 MG/ML IV SOLN
200.0000 mg | Freq: Once | INTRAVENOUS | Status: AC
  Administered 2023-11-20: 200 mg via INTRAVENOUS
  Filled 2023-11-20: qty 10

## 2023-11-20 MED ORDER — ACETAMINOPHEN 325 MG PO TABS
650.0000 mg | ORAL_TABLET | Freq: Once | ORAL | Status: AC
Start: 2023-11-20 — End: 2023-11-20
  Administered 2023-11-20: 650 mg via ORAL
  Filled 2023-11-20: qty 2

## 2023-11-20 NOTE — Patient Instructions (Signed)

## 2023-11-22 ENCOUNTER — Other Ambulatory Visit: Payer: Self-pay | Admitting: Hematology and Oncology

## 2023-11-22 ENCOUNTER — Inpatient Hospital Stay: Payer: Medicare PPO

## 2023-11-22 ENCOUNTER — Other Ambulatory Visit: Payer: Self-pay | Admitting: Pharmacist

## 2023-11-22 VITALS — BP 108/56 | HR 64 | Temp 98.0°F | Resp 18

## 2023-11-22 DIAGNOSIS — K219 Gastro-esophageal reflux disease without esophagitis: Secondary | ICD-10-CM | POA: Diagnosis not present

## 2023-11-22 DIAGNOSIS — N400 Enlarged prostate without lower urinary tract symptoms: Secondary | ICD-10-CM | POA: Diagnosis not present

## 2023-11-22 DIAGNOSIS — D509 Iron deficiency anemia, unspecified: Secondary | ICD-10-CM | POA: Diagnosis not present

## 2023-11-22 DIAGNOSIS — I2510 Atherosclerotic heart disease of native coronary artery without angina pectoris: Secondary | ICD-10-CM | POA: Diagnosis not present

## 2023-11-22 DIAGNOSIS — M1990 Unspecified osteoarthritis, unspecified site: Secondary | ICD-10-CM | POA: Diagnosis not present

## 2023-11-22 DIAGNOSIS — D472 Monoclonal gammopathy: Secondary | ICD-10-CM | POA: Diagnosis not present

## 2023-11-22 DIAGNOSIS — K922 Gastrointestinal hemorrhage, unspecified: Secondary | ICD-10-CM | POA: Diagnosis not present

## 2023-11-22 DIAGNOSIS — E785 Hyperlipidemia, unspecified: Secondary | ICD-10-CM | POA: Diagnosis not present

## 2023-11-22 DIAGNOSIS — E538 Deficiency of other specified B group vitamins: Secondary | ICD-10-CM | POA: Diagnosis not present

## 2023-11-22 LAB — MULTIPLE MYELOMA PANEL, SERUM
Albumin SerPl Elph-Mcnc: 3.4 g/dL (ref 2.9–4.4)
Albumin/Glob SerPl: 1.1 (ref 0.7–1.7)
Alpha 1: 0.3 g/dL (ref 0.0–0.4)
Alpha2 Glob SerPl Elph-Mcnc: 0.8 g/dL (ref 0.4–1.0)
B-Globulin SerPl Elph-Mcnc: 0.8 g/dL (ref 0.7–1.3)
Gamma Glob SerPl Elph-Mcnc: 1.2 g/dL (ref 0.4–1.8)
Globulin, Total: 3.2 g/dL (ref 2.2–3.9)
IgA: 144 mg/dL (ref 61–437)
IgG (Immunoglobin G), Serum: 1322 mg/dL (ref 603–1613)
IgM (Immunoglobulin M), Srm: 6 mg/dL — ABNORMAL LOW (ref 15–143)
M Protein SerPl Elph-Mcnc: 0.7 g/dL — ABNORMAL HIGH
Total Protein ELP: 6.6 g/dL (ref 6.0–8.5)

## 2023-11-22 MED ORDER — SODIUM CHLORIDE 0.9 % IV SOLN: INTRAVENOUS | Status: DC

## 2023-11-22 MED ORDER — ACETAMINOPHEN 325 MG PO TABS
650.0000 mg | ORAL_TABLET | Freq: Once | ORAL | Status: DC
Start: 2023-11-22 — End: 2023-11-22
  Filled 2023-11-22: qty 2

## 2023-11-22 MED ORDER — IRON SUCROSE 20 MG/ML IV SOLN
200.0000 mg | Freq: Once | INTRAVENOUS | Status: AC
  Administered 2023-11-22: 200 mg via INTRAVENOUS
  Filled 2023-11-22: qty 10

## 2023-11-22 MED ORDER — LORATADINE 10 MG PO TABS
10.0000 mg | ORAL_TABLET | Freq: Once | ORAL | Status: DC
  Filled 2023-11-22: qty 1

## 2023-11-22 NOTE — Patient Instructions (Signed)

## 2023-11-24 ENCOUNTER — Inpatient Hospital Stay: Payer: Medicare PPO

## 2023-11-24 VITALS — BP 95/78 | HR 77 | Temp 97.6°F | Resp 18 | Ht 75.0 in

## 2023-11-24 DIAGNOSIS — E785 Hyperlipidemia, unspecified: Secondary | ICD-10-CM | POA: Diagnosis not present

## 2023-11-24 DIAGNOSIS — D472 Monoclonal gammopathy: Secondary | ICD-10-CM | POA: Diagnosis not present

## 2023-11-24 DIAGNOSIS — N400 Enlarged prostate without lower urinary tract symptoms: Secondary | ICD-10-CM | POA: Diagnosis not present

## 2023-11-24 DIAGNOSIS — K219 Gastro-esophageal reflux disease without esophagitis: Secondary | ICD-10-CM | POA: Diagnosis not present

## 2023-11-24 DIAGNOSIS — D509 Iron deficiency anemia, unspecified: Secondary | ICD-10-CM

## 2023-11-24 DIAGNOSIS — M1990 Unspecified osteoarthritis, unspecified site: Secondary | ICD-10-CM | POA: Diagnosis not present

## 2023-11-24 DIAGNOSIS — I2510 Atherosclerotic heart disease of native coronary artery without angina pectoris: Secondary | ICD-10-CM | POA: Diagnosis not present

## 2023-11-24 DIAGNOSIS — K922 Gastrointestinal hemorrhage, unspecified: Secondary | ICD-10-CM | POA: Diagnosis not present

## 2023-11-24 DIAGNOSIS — E538 Deficiency of other specified B group vitamins: Secondary | ICD-10-CM | POA: Diagnosis not present

## 2023-11-24 MED ORDER — LORATADINE 10 MG PO TABS
10.0000 mg | ORAL_TABLET | Freq: Once | ORAL | Status: DC
  Filled 2023-11-24: qty 1

## 2023-11-24 MED ORDER — IRON SUCROSE 20 MG/ML IV SOLN
200.0000 mg | Freq: Once | INTRAVENOUS | Status: AC
  Administered 2023-11-24: 200 mg via INTRAVENOUS
  Filled 2023-11-24: qty 10

## 2023-11-24 MED ORDER — SODIUM CHLORIDE 0.9 % IV SOLN: INTRAVENOUS | Status: DC

## 2023-11-24 MED ORDER — ACETAMINOPHEN 325 MG PO TABS
650.0000 mg | ORAL_TABLET | Freq: Once | ORAL | Status: DC
  Filled 2023-11-24: qty 2

## 2023-11-24 NOTE — Patient Instructions (Signed)

## 2023-11-27 ENCOUNTER — Inpatient Hospital Stay: Payer: Medicare PPO

## 2023-11-27 VITALS — BP 99/52 | HR 110 | Temp 97.6°F | Resp 20

## 2023-11-27 DIAGNOSIS — D472 Monoclonal gammopathy: Secondary | ICD-10-CM | POA: Diagnosis not present

## 2023-11-27 DIAGNOSIS — I2510 Atherosclerotic heart disease of native coronary artery without angina pectoris: Secondary | ICD-10-CM | POA: Diagnosis not present

## 2023-11-27 DIAGNOSIS — M1990 Unspecified osteoarthritis, unspecified site: Secondary | ICD-10-CM | POA: Diagnosis not present

## 2023-11-27 DIAGNOSIS — D509 Iron deficiency anemia, unspecified: Secondary | ICD-10-CM | POA: Diagnosis not present

## 2023-11-27 DIAGNOSIS — E538 Deficiency of other specified B group vitamins: Secondary | ICD-10-CM | POA: Diagnosis not present

## 2023-11-27 DIAGNOSIS — N400 Enlarged prostate without lower urinary tract symptoms: Secondary | ICD-10-CM | POA: Diagnosis not present

## 2023-11-27 DIAGNOSIS — E785 Hyperlipidemia, unspecified: Secondary | ICD-10-CM | POA: Diagnosis not present

## 2023-11-27 DIAGNOSIS — K922 Gastrointestinal hemorrhage, unspecified: Secondary | ICD-10-CM | POA: Diagnosis not present

## 2023-11-27 DIAGNOSIS — K219 Gastro-esophageal reflux disease without esophagitis: Secondary | ICD-10-CM | POA: Diagnosis not present

## 2023-11-27 MED ORDER — LORATADINE 10 MG PO TABS: 10.0000 mg | ORAL_TABLET | Freq: Once | ORAL | Status: DC

## 2023-11-27 MED ORDER — IRON SUCROSE 20 MG/ML IV SOLN
200.0000 mg | Freq: Once | INTRAVENOUS | Status: AC
Start: 2023-11-27 — End: 2023-11-27
  Administered 2023-11-27: 200 mg via INTRAVENOUS
  Filled 2023-11-27: qty 10

## 2023-11-27 MED ORDER — ACETAMINOPHEN 325 MG PO TABS: 650.0000 mg | ORAL_TABLET | Freq: Once | ORAL | Status: DC

## 2023-11-27 NOTE — Patient Instructions (Signed)

## 2023-11-29 ENCOUNTER — Inpatient Hospital Stay: Payer: Medicare PPO

## 2023-11-29 VITALS — BP 92/68 | HR 78 | Temp 97.8°F | Resp 20

## 2023-11-29 DIAGNOSIS — E785 Hyperlipidemia, unspecified: Secondary | ICD-10-CM | POA: Diagnosis not present

## 2023-11-29 DIAGNOSIS — E538 Deficiency of other specified B group vitamins: Secondary | ICD-10-CM | POA: Diagnosis not present

## 2023-11-29 DIAGNOSIS — K219 Gastro-esophageal reflux disease without esophagitis: Secondary | ICD-10-CM | POA: Diagnosis not present

## 2023-11-29 DIAGNOSIS — M1990 Unspecified osteoarthritis, unspecified site: Secondary | ICD-10-CM | POA: Diagnosis not present

## 2023-11-29 DIAGNOSIS — K922 Gastrointestinal hemorrhage, unspecified: Secondary | ICD-10-CM | POA: Diagnosis not present

## 2023-11-29 DIAGNOSIS — D509 Iron deficiency anemia, unspecified: Secondary | ICD-10-CM | POA: Diagnosis not present

## 2023-11-29 DIAGNOSIS — I2510 Atherosclerotic heart disease of native coronary artery without angina pectoris: Secondary | ICD-10-CM | POA: Diagnosis not present

## 2023-11-29 DIAGNOSIS — N400 Enlarged prostate without lower urinary tract symptoms: Secondary | ICD-10-CM | POA: Diagnosis not present

## 2023-11-29 DIAGNOSIS — D472 Monoclonal gammopathy: Secondary | ICD-10-CM | POA: Diagnosis not present

## 2023-11-29 MED ORDER — ACETAMINOPHEN 325 MG PO TABS: 650.0000 mg | ORAL_TABLET | Freq: Once | ORAL | Status: DC

## 2023-11-29 MED ORDER — IRON SUCROSE 20 MG/ML IV SOLN
200.0000 mg | Freq: Once | INTRAVENOUS | Status: AC
  Administered 2023-11-29: 200 mg via INTRAVENOUS
  Filled 2023-11-29: qty 10

## 2023-11-29 MED ORDER — SODIUM CHLORIDE 0.9 % IV SOLN: INTRAVENOUS | Status: DC

## 2023-11-29 MED ORDER — LORATADINE 10 MG PO TABS: 10.0000 mg | ORAL_TABLET | Freq: Once | ORAL | Status: DC

## 2023-11-29 MED ORDER — SODIUM CHLORIDE 0.9% FLUSH
10.0000 mL | Freq: Once | INTRAVENOUS | Status: AC | PRN
  Administered 2023-11-29: 10 mL

## 2023-11-29 NOTE — Patient Instructions (Signed)

## 2023-12-25 DIAGNOSIS — Z9181 History of falling: Secondary | ICD-10-CM | POA: Diagnosis not present

## 2023-12-25 DIAGNOSIS — Z0000 Encounter for general adult medical examination without abnormal findings: Secondary | ICD-10-CM | POA: Diagnosis not present

## 2024-01-30 DEATH — deceased

## 2024-02-12 ENCOUNTER — Other Ambulatory Visit: Payer: Medicare PPO

## 2024-02-12 ENCOUNTER — Ambulatory Visit: Payer: Medicare PPO | Admitting: Hematology and Oncology
